# Patient Record
Sex: Female | Born: 1965
Health system: Southern US, Community
[De-identification: ages and names within clinical notes are randomized; demographics above are authoritative.]

## PROBLEM LIST (undated history)

## (undated) DIAGNOSIS — M722 Plantar fascial fibromatosis: Secondary | ICD-10-CM

## (undated) DIAGNOSIS — T7840XA Allergy, unspecified, initial encounter: Secondary | ICD-10-CM

## (undated) DIAGNOSIS — L309 Dermatitis, unspecified: Secondary | ICD-10-CM

## (undated) DIAGNOSIS — L709 Acne, unspecified: Secondary | ICD-10-CM

## (undated) DIAGNOSIS — D649 Anemia, unspecified: Secondary | ICD-10-CM

## (undated) HISTORY — DX: Dermatitis, unspecified: L30.9

## (undated) HISTORY — DX: Allergy, unspecified, initial encounter: T78.40XA

## (undated) HISTORY — DX: Plantar fascial fibromatosis: M72.2

## (undated) HISTORY — DX: Acne, unspecified: L70.9

## (undated) HISTORY — DX: Anemia, unspecified: D64.9

---

## 1993-10-30 HISTORY — PX: PELVIC LAPAROSCOPY: SHX162

## 1999-06-16 ENCOUNTER — Other Ambulatory Visit: Admission: RE | Admit: 1999-06-16 | Discharge: 1999-06-16 | Payer: Self-pay | Admitting: Gynecology

## 1999-09-09 ENCOUNTER — Other Ambulatory Visit: Admission: RE | Admit: 1999-09-09 | Discharge: 1999-09-09 | Payer: Self-pay | Admitting: Gynecology

## 2000-07-20 ENCOUNTER — Other Ambulatory Visit: Admission: RE | Admit: 2000-07-20 | Discharge: 2000-07-20 | Payer: Self-pay | Admitting: Gynecology

## 2001-08-01 ENCOUNTER — Other Ambulatory Visit: Admission: RE | Admit: 2001-08-01 | Discharge: 2001-08-01 | Payer: Self-pay | Admitting: Gynecology

## 2002-06-20 ENCOUNTER — Other Ambulatory Visit: Admission: RE | Admit: 2002-06-20 | Discharge: 2002-06-20 | Payer: Self-pay | Admitting: Gynecology

## 2003-06-27 ENCOUNTER — Encounter: Admission: RE | Admit: 2003-06-27 | Discharge: 2003-06-27 | Payer: Self-pay | Admitting: Gynecology

## 2003-06-28 ENCOUNTER — Other Ambulatory Visit: Admission: RE | Admit: 2003-06-28 | Discharge: 2003-06-28 | Payer: Self-pay | Admitting: Gynecology

## 2003-07-03 ENCOUNTER — Ambulatory Visit (HOSPITAL_COMMUNITY): Admission: RE | Admit: 2003-07-03 | Discharge: 2003-07-03 | Payer: Self-pay | Admitting: Family Medicine

## 2003-07-03 ENCOUNTER — Encounter (INDEPENDENT_AMBULATORY_CARE_PROVIDER_SITE_OTHER): Payer: Self-pay | Admitting: Cardiology

## 2004-07-25 ENCOUNTER — Other Ambulatory Visit: Admission: RE | Admit: 2004-07-25 | Discharge: 2004-07-25 | Payer: Self-pay | Admitting: Gynecology

## 2005-07-30 ENCOUNTER — Other Ambulatory Visit: Admission: RE | Admit: 2005-07-30 | Discharge: 2005-07-30 | Payer: Self-pay | Admitting: Gynecology

## 2005-08-06 ENCOUNTER — Emergency Department (HOSPITAL_COMMUNITY): Admission: EM | Admit: 2005-08-06 | Discharge: 2005-08-06 | Payer: Self-pay | Admitting: Emergency Medicine

## 2006-08-02 ENCOUNTER — Other Ambulatory Visit: Admission: RE | Admit: 2006-08-02 | Discharge: 2006-08-02 | Payer: Self-pay | Admitting: Gynecology

## 2007-08-03 ENCOUNTER — Other Ambulatory Visit: Admission: RE | Admit: 2007-08-03 | Discharge: 2007-08-03 | Payer: Self-pay | Admitting: Gynecology

## 2007-10-07 ENCOUNTER — Encounter: Admission: RE | Admit: 2007-10-07 | Discharge: 2007-10-07 | Payer: Self-pay | Admitting: Gynecology

## 2008-08-06 ENCOUNTER — Ambulatory Visit: Payer: Self-pay | Admitting: Gynecology

## 2008-08-06 ENCOUNTER — Other Ambulatory Visit: Admission: RE | Admit: 2008-08-06 | Discharge: 2008-08-06 | Payer: Self-pay | Admitting: Gynecology

## 2008-08-06 ENCOUNTER — Encounter: Payer: Self-pay | Admitting: Gynecology

## 2008-10-19 ENCOUNTER — Encounter: Admission: RE | Admit: 2008-10-19 | Discharge: 2008-10-19 | Payer: Self-pay | Admitting: Gynecology

## 2009-08-08 ENCOUNTER — Ambulatory Visit: Payer: Self-pay | Admitting: Gynecology

## 2009-08-08 ENCOUNTER — Other Ambulatory Visit: Admission: RE | Admit: 2009-08-08 | Discharge: 2009-08-08 | Payer: Self-pay | Admitting: Gynecology

## 2009-08-09 ENCOUNTER — Ambulatory Visit: Payer: Self-pay | Admitting: Gynecology

## 2009-10-21 ENCOUNTER — Encounter: Admission: RE | Admit: 2009-10-21 | Discharge: 2009-10-21 | Payer: Self-pay | Admitting: Gynecology

## 2010-01-03 HISTORY — PX: TRANSTHORACIC ECHOCARDIOGRAM: SHX275

## 2010-06-01 ENCOUNTER — Encounter: Payer: Self-pay | Admitting: Gynecology

## 2010-06-02 ENCOUNTER — Encounter: Payer: Self-pay | Admitting: Gynecology

## 2010-08-11 ENCOUNTER — Other Ambulatory Visit: Payer: Self-pay | Admitting: Gynecology

## 2010-08-11 ENCOUNTER — Other Ambulatory Visit (HOSPITAL_COMMUNITY)
Admission: RE | Admit: 2010-08-11 | Discharge: 2010-08-11 | Disposition: A | Discharge status: Home / Self Care | Payer: BC Managed Care – PPO | Source: Ambulatory Visit | Attending: Gynecology | Admitting: Gynecology

## 2010-08-11 ENCOUNTER — Encounter (INDEPENDENT_AMBULATORY_CARE_PROVIDER_SITE_OTHER): Payer: BC Managed Care – PPO | Admitting: Gynecology

## 2010-08-11 DIAGNOSIS — B373 Candidiasis of vulva and vagina: Secondary | ICD-10-CM

## 2010-08-11 DIAGNOSIS — Z124 Encounter for screening for malignant neoplasm of cervix: Secondary | ICD-10-CM | POA: Insufficient documentation

## 2010-08-11 DIAGNOSIS — Z833 Family history of diabetes mellitus: Secondary | ICD-10-CM

## 2010-08-11 DIAGNOSIS — N912 Amenorrhea, unspecified: Secondary | ICD-10-CM

## 2010-08-11 DIAGNOSIS — Z01419 Encounter for gynecological examination (general) (routine) without abnormal findings: Secondary | ICD-10-CM

## 2010-08-11 DIAGNOSIS — Z13220 Encounter for screening for lipoid disorders: Secondary | ICD-10-CM

## 2010-09-26 ENCOUNTER — Other Ambulatory Visit: Payer: Self-pay | Admitting: Gynecology

## 2010-09-26 DIAGNOSIS — Z1231 Encounter for screening mammogram for malignant neoplasm of breast: Secondary | ICD-10-CM

## 2010-10-23 ENCOUNTER — Ambulatory Visit: Payer: BC Managed Care – PPO

## 2010-10-24 ENCOUNTER — Ambulatory Visit
Admission: RE | Admit: 2010-10-24 | Discharge: 2010-10-24 | Disposition: A | Discharge status: Home / Self Care | Payer: BC Managed Care – PPO | Source: Ambulatory Visit | Attending: Gynecology | Admitting: Gynecology

## 2010-10-24 DIAGNOSIS — Z1231 Encounter for screening mammogram for malignant neoplasm of breast: Secondary | ICD-10-CM

## 2011-06-09 ENCOUNTER — Encounter: Payer: Self-pay | Admitting: Gynecology

## 2011-06-09 ENCOUNTER — Ambulatory Visit (INDEPENDENT_AMBULATORY_CARE_PROVIDER_SITE_OTHER): Payer: Self-pay | Admitting: Gynecology

## 2011-06-09 VITALS — BP 130/90

## 2011-06-09 DIAGNOSIS — N912 Amenorrhea, unspecified: Secondary | ICD-10-CM

## 2011-06-09 DIAGNOSIS — N949 Unspecified condition associated with female genital organs and menstrual cycle: Secondary | ICD-10-CM

## 2011-06-09 DIAGNOSIS — D219 Benign neoplasm of connective and other soft tissue, unspecified: Secondary | ICD-10-CM

## 2011-06-09 DIAGNOSIS — R102 Pelvic and perineal pain: Secondary | ICD-10-CM

## 2011-06-09 DIAGNOSIS — D259 Leiomyoma of uterus, unspecified: Secondary | ICD-10-CM

## 2011-06-09 LAB — PROLACTIN: Prolactin: 9.4 ng/mL

## 2011-06-09 NOTE — Progress Notes (Signed)
Patient is a 46 year old gravida 0 with complaint today of not having a menstrual period for the past 3 months. She has not been sexually active in 7 months. She is on Junel 120 oral contraceptive pill and has been compliant. Patient has gained some weight but denies any visual disturbances, headaches or nipple discharge. Patient with prior history several years ago of laparoscopic myomectomy. Patient complaining of vague pelvic pains from midline to left lower quadrant.  Exam: Abdomen: Soft nontender no rebound or guarding Pelvic: Bartholin urethra Skene glands within normal limits Vagina: No gross lesions on inspection Cervix no lesions or discharge  Uterus anteverted irregular shaped fundal region at the patient's left side no tenderness elicited. Adnexa: No palpable masses or tenderness Rectal: Not examined  UPT: Negative  Assessment: Amenorrhea while on oral contraceptive pill. Patient reassured that she maintains good compliance there is no concern or worry of not having a menstrual cycle. We'll check her TSH and prolactin. As a result of her pelvic pain and irregularity on the uterus and prior history of myomectomy for fibroids will schedule an ultrasound within the next week.

## 2011-06-09 NOTE — Patient Instructions (Signed)
Its ok not to have a menstrual period while on the birth control pill as long as you don't miss any. Will take a good look with ultrasound of your uterus because I believe the fibroids are back and may be the source of your recent discomfort. Will check your TSH and Prolactin to make sure all are ok.

## 2011-06-10 ENCOUNTER — Encounter: Payer: Self-pay | Admitting: Gynecology

## 2011-06-10 ENCOUNTER — Ambulatory Visit (INDEPENDENT_AMBULATORY_CARE_PROVIDER_SITE_OTHER): Payer: Managed Care, Other (non HMO)

## 2011-06-10 ENCOUNTER — Ambulatory Visit (INDEPENDENT_AMBULATORY_CARE_PROVIDER_SITE_OTHER): Payer: Managed Care, Other (non HMO) | Admitting: Gynecology

## 2011-06-10 VITALS — BP 132/88

## 2011-06-10 DIAGNOSIS — D219 Benign neoplasm of connective and other soft tissue, unspecified: Secondary | ICD-10-CM

## 2011-06-10 DIAGNOSIS — N912 Amenorrhea, unspecified: Secondary | ICD-10-CM

## 2011-06-10 DIAGNOSIS — R102 Pelvic and perineal pain: Secondary | ICD-10-CM

## 2011-06-10 DIAGNOSIS — D259 Leiomyoma of uterus, unspecified: Secondary | ICD-10-CM

## 2011-06-10 DIAGNOSIS — N949 Unspecified condition associated with female genital organs and menstrual cycle: Secondary | ICD-10-CM

## 2011-06-10 DIAGNOSIS — D251 Intramural leiomyoma of uterus: Secondary | ICD-10-CM

## 2011-06-10 NOTE — Patient Instructions (Signed)
Your ultrasound today was normal with exception of 2 very small insignificant fibroids. Remember to continue oral contraceptive pills as previously prescribed. Your recent labs were normal also. We'll see you next year her to sleep.

## 2011-06-10 NOTE — Progress Notes (Addendum)
Patient is a 46 year old gravida 0 concern that she had not had a menstrual period in 3 months although she is on the oral contraceptive pill Junel 1/20. She was seen the office in January 29 for annual exam.  She has been compliant on her oral contraceptive pill. She also has history of fibroids in the past and has pelvic pains midline radiating to left lower quadrant. Her TSH and FSH recently drawn were normal.  Results of ultrasound as follows: (October 2012) Uterus 7.1 x 3.7 x 2.4 cm with an endometrial stripe of 1.5 mm. She had 2 small fibroids one measured 11 x 9 mm and another 1 6 x 6 mm. Both ovaries are otherwise normal.  Patient was reassured she's otherwise scheduled to return back to the office next year or when necessary.

## 2011-07-01 ENCOUNTER — Ambulatory Visit (INDEPENDENT_AMBULATORY_CARE_PROVIDER_SITE_OTHER): Payer: Managed Care, Other (non HMO) | Admitting: Gynecology

## 2011-07-01 ENCOUNTER — Encounter: Payer: Self-pay | Admitting: Gynecology

## 2011-07-01 VITALS — BP 132/86

## 2011-07-01 DIAGNOSIS — N6459 Other signs and symptoms in breast: Secondary | ICD-10-CM

## 2011-07-01 DIAGNOSIS — N61 Mastitis without abscess: Secondary | ICD-10-CM

## 2011-07-01 DIAGNOSIS — L539 Erythematous condition, unspecified: Secondary | ICD-10-CM

## 2011-07-01 DIAGNOSIS — N649 Disorder of breast, unspecified: Secondary | ICD-10-CM

## 2011-07-01 DIAGNOSIS — L538 Other specified erythematous conditions: Secondary | ICD-10-CM

## 2011-07-01 MED ORDER — NYSTATIN-TRIAMCINOLONE 100000-0.1 UNIT/GM-% EX CREA: TOPICAL_CREAM | Freq: Three times a day (TID) | CUTANEOUS | Status: AC

## 2011-07-01 NOTE — Patient Instructions (Signed)
Will call you with follow up appt. With General Surgeon

## 2011-07-01 NOTE — Progress Notes (Signed)
Patient presented to the office today complaining that over the past week she has had left nipple pruritus and burning sensation and has noted a raised area. Patient states that she has not palpated any masses and denies any nipple discharge. Patient had a normal mammogram May 2012. Patient with no first-line relative with breast cancer only a distant aunt. Patient's currently on low dose oral contraceptive pill (Junel 1/20). Patient denies any recent trauma or injury to the area.  Breast exam: Right breast: No palpable masses or tenderness no skin discoloration no nipple inversion no supraclavicular axillary lymphadenopathy  Left breast: From the 10 to 12:00 position on the areolar region is a 1-2 cm raised, scaly hyperpigmented lesion. No other abnormality on the left breast noted no masses, no supraclavicular axillary lymphadenopathy.   Physical Exam  Pulmonary/Chest:     Assessment/plan: Patient will be started on a combination of steroid and antifungal cream (mytrex) to apply 2-3 times a day for approximately 10-14 days in the event that this is a fundal lesion. Nevertheless, I would like to refer to the general surgeon in the event this is not clear and she will need a biopsy to rule out cancer. Other possibility may be psoriasis. Information was shared with the patient and she will follow accordingly and we'll wait for followup with a general surgeon after completion of the above-mentioned treatment.

## 2011-07-02 ENCOUNTER — Telehealth: Payer: Self-pay | Admitting: *Deleted

## 2011-07-02 NOTE — Telephone Encounter (Signed)
Message copied by Libby Maw on Thu Jul 02, 2011  4:10 PM ------      Message from: Ok Edwards      Created: Wed Jul 01, 2011  2:08 PM       Florence Antonelli, please schedule an appointment with one of the general surgeons in 10-14 days as a result of a left breast lesion. Please forward my encounter note from today. Patient waiting your call. Thank you

## 2011-07-02 NOTE — Telephone Encounter (Signed)
Lm for patient to call. appt set up with Dr. Dwain Sarna on 07/21/11 @9 :10am with 8:45am arrival

## 2011-07-06 NOTE — Telephone Encounter (Signed)
Patient returned call, told patient we had made appt for Dr. Dwain Sarna to see her and she said she didn't need appointment.  The area in question has cleared up.  Wanted to let you know.

## 2011-07-06 NOTE — Telephone Encounter (Signed)
Called [facility] and cancelled appt.

## 2011-07-06 NOTE — Telephone Encounter (Signed)
Lm for patient to call

## 2011-07-06 NOTE — Telephone Encounter (Signed)
I tried to call patient and left a message. And antifungal cream to clear the area completely and she feels no masses and she can cancel the appointment with a general surgeon. She has any questions she can call me or come by the office for me to reinspect again.

## 2011-07-21 ENCOUNTER — Ambulatory Visit (INDEPENDENT_AMBULATORY_CARE_PROVIDER_SITE_OTHER): Payer: Self-pay | Admitting: General Surgery

## 2011-08-14 ENCOUNTER — Ambulatory Visit (INDEPENDENT_AMBULATORY_CARE_PROVIDER_SITE_OTHER): Payer: Managed Care, Other (non HMO) | Admitting: Gynecology

## 2011-08-14 ENCOUNTER — Encounter: Payer: Self-pay | Admitting: Gynecology

## 2011-08-14 ENCOUNTER — Encounter: Payer: Managed Care, Other (non HMO) | Admitting: Gynecology

## 2011-08-14 VITALS — BP 120/78 | Ht 63.0 in | Wt 185.0 lb

## 2011-08-14 DIAGNOSIS — Z833 Family history of diabetes mellitus: Secondary | ICD-10-CM

## 2011-08-14 DIAGNOSIS — R635 Abnormal weight gain: Secondary | ICD-10-CM

## 2011-08-14 DIAGNOSIS — N949 Unspecified condition associated with female genital organs and menstrual cycle: Secondary | ICD-10-CM

## 2011-08-14 DIAGNOSIS — Z01419 Encounter for gynecological examination (general) (routine) without abnormal findings: Secondary | ICD-10-CM

## 2011-08-14 DIAGNOSIS — R102 Pelvic and perineal pain: Secondary | ICD-10-CM

## 2011-08-14 MED ORDER — NORETHIN ACE-ETH ESTRAD-FE 1-20 MG-MCG PO TABS: 1.0000 | ORAL_TABLET | Freq: Every day | ORAL | Status: DC

## 2011-08-14 NOTE — Patient Instructions (Signed)
Remember to follow up with your mammogram in May. Will see you in 1-2 weeks for ultrasound. Continue to exercise and eat healthy.

## 2011-08-14 NOTE — Progress Notes (Signed)
Colleen Gonzalez June 24, 1965 [id number]   History:    46 y.o.  for annual exam with complaint of weight gain and left lower quadrant discomfort. 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 Normal ovaries. Patient has had history in the past of a laparoscopic myomectomy. She's currently on Janel 120 oral contraceptive pill. Her last mammogram was in 2012. Patient does her monthly self breast examination. Patient with strong family history of diabetes. Her other concern is her weight gain as well.  Past medical history,surgical history, family history and social history were all reviewed and documented in the EPIC chart.  Gynecologic History No LMP recorded. Patient is not currently having periods (Reason: Oral contraceptives). Contraception: OCP (estrogen/progesterone) Last Pap: 2012. Results were: normal Last mammogram: 2012. Results were: normal  Obstetric History OB History    Grav Para Term Preterm Abortions TAB SAB Ect Mult Living                   ROS:  Was performed and pertinent positives and negatives are included in the history.  Exam: chaperone present  BP 120/78  Ht 5\' 3"  (1.6 m)  Wt 185 lb (83.915 kg)  BMI 32.77 kg/m2  Body mass index is 32.77 kg/(m^2).  General appearance : Well developed well nourished female. No acute distress HEENT: Neck supple, trachea midline, no carotid bruits, no thyroidmegaly Lungs: Clear to auscultation, no rhonchi or wheezes, or rib retractions  Heart: Regular rate and rhythm, no murmurs or gallops Breast:Examined in sitting and supine position were symmetrical in appearance, no palpable masses or tenderness,  no skin retraction, no nipple inversion, no nipple discharge, no skin discoloration, no axillary or supraclavicular lymphadenopathy Abdomen: no palpable masses or tenderness, no rebound or guarding Extremities: no edema or skin discoloration or  tenderness  Pelvic:  Bartholin, Urethra, Skene Glands: Within normal limits             Vagina: No gross lesions or discharge  Cervix: No gross lesions or discharge  Uterus  anteverted slightly irregular, normal size, shape and consistency, non-tender and mobile  Adnexa  tender on the left adnexa difficult to evaluate  Anus and perineum  normal   Rectovaginal  normal sphincter tone without palpated masses or tenderness             Hemoccult not done, normal rectal exam     Assessment/Plan:  45 y.o. female for annual exam who has been complaining on and off at times of left lower quadrant discomfort. Patient had ultrasound done in October 2012. She has 2 small fibroids measuring 11 x 9 mm and 6 x 6 mm respectively and normal-appearing ovaries during that scan. She has no GI or GU complaints. We'll schedule an ultrasound for next week to see if there is any change in the past 6 months because of her left lower quadrant discomfort and incomplete evaluation today during pelvic exam. Because her family history of diabetes we'll do a fasting blood sugar today and because of her weight gain we'll check her TSH today. Additional labs will include a CBC and urinalysis as well as a fasting lipid profile. New Pap smear screening guidelines discussed. Patient would no prior history of abnormal Pap smears. Patient with normal Pap smear in 2012 will not need one for 2 more years. She was encouraged to schedule her mammogram and to continue to do her  monthly self breast examination. We discussed importance of regular exercise and diet.    Ok Edwards MD, 4:44 PM 08/14/2011

## 2011-08-15 LAB — URINALYSIS W MICROSCOPIC + REFLEX CULTURE
Hgb urine dipstick: NEGATIVE
Leukocytes, UA: NEGATIVE
Nitrite: NEGATIVE
Protein, ur: NEGATIVE mg/dL

## 2011-08-15 LAB — CBC WITH DIFFERENTIAL/PLATELET
Basophils Relative: 1 % (ref 0–1)
Eosinophils Absolute: 0.3 10*3/uL (ref 0.0–0.7)
MCH: 27.6 pg (ref 26.0–34.0)
MCHC: 31.2 g/dL (ref 30.0–36.0)
Neutrophils Relative %: 39 % — ABNORMAL LOW (ref 43–77)
Platelets: 370 10*3/uL (ref 150–400)
RBC: 4.17 MIL/uL (ref 3.87–5.11)

## 2011-08-15 LAB — LIPID PANEL
Cholesterol: 163 mg/dL (ref 0–200)
Total CHOL/HDL Ratio: 2.9 Ratio
Triglycerides: 71 mg/dL (ref <150)
VLDL: 14 mg/dL (ref 0–40)

## 2011-08-16 LAB — URINE CULTURE
Colony Count: NO GROWTH
Organism ID, Bacteria: NO GROWTH

## 2011-08-19 ENCOUNTER — Other Ambulatory Visit: Payer: Self-pay

## 2011-08-19 DIAGNOSIS — R3129 Other microscopic hematuria: Secondary | ICD-10-CM

## 2011-08-28 ENCOUNTER — Ambulatory Visit (INDEPENDENT_AMBULATORY_CARE_PROVIDER_SITE_OTHER): Payer: Managed Care, Other (non HMO)

## 2011-08-28 ENCOUNTER — Encounter: Payer: Self-pay | Admitting: Gynecology

## 2011-08-28 ENCOUNTER — Ambulatory Visit (INDEPENDENT_AMBULATORY_CARE_PROVIDER_SITE_OTHER): Payer: Managed Care, Other (non HMO) | Admitting: Gynecology

## 2011-08-28 DIAGNOSIS — R102 Pelvic and perineal pain: Secondary | ICD-10-CM

## 2011-08-28 DIAGNOSIS — D259 Leiomyoma of uterus, unspecified: Secondary | ICD-10-CM

## 2011-08-28 DIAGNOSIS — R3129 Other microscopic hematuria: Secondary | ICD-10-CM

## 2011-08-28 DIAGNOSIS — D649 Anemia, unspecified: Secondary | ICD-10-CM

## 2011-08-28 DIAGNOSIS — R109 Unspecified abdominal pain: Secondary | ICD-10-CM

## 2011-08-28 NOTE — Progress Notes (Signed)
46 y.o. for annual exam with complaint of weight gain and left lower quadrant discomfort. 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 Normal ovaries. Patient has had history in the past of a laparoscopic myomectomy. She's currently on Janel 120 oral contraceptive pill. Patient presented to the office today for followup ultrasound. The ultrasound with the following findings:  Uterus measures 7.1 x 3.7 x 3.0 cm with an endometrial stripe of 2.9 mm 2 small fibroids one measuring 12 x 8 mm a second one measuring 8 x 6 mm essentially no change from last year. Both ovaries appeared to be normal otherwise.  Review of her record indicated the patient continues to suffer from chronic anemia for which she is on iron supplementation daily and her last CBC demonstrated a hemoglobin 11.5 with hematocrit 36.9 with normal indices. She's is hardly having any menstrual cycle on the oral contraceptive pill. She states her bowel movements are normal. She denies any family history of any GI malignancies. She denies any blood in her stools or feeling bloated.  Urinalysis be repeated today due to the fact that time of her annual exam there was evidence of microscopic hematuria patient states she was here that time of her menses.  Patient will be referred to the gastroenterologist for further evaluation of her left lower abdominal pains and chronic anemia. Her recent lipid profile was normal as was her blood sugar.

## 2011-08-28 NOTE — Patient Instructions (Signed)
Will call you with appointment to [facility] for follow up.

## 2011-08-29 LAB — URINALYSIS W MICROSCOPIC + REFLEX CULTURE
Bacteria, UA: NONE SEEN
Crystals: NONE SEEN
Ketones, ur: NEGATIVE mg/dL
Nitrite: NEGATIVE
Protein, ur: NEGATIVE mg/dL
Specific Gravity, Urine: 1.02 (ref 1.005–1.030)
Urobilinogen, UA: 0.2 mg/dL (ref 0.0–1.0)

## 2011-08-31 ENCOUNTER — Other Ambulatory Visit: Payer: Self-pay | Admitting: *Deleted

## 2011-08-31 ENCOUNTER — Encounter: Payer: Self-pay | Admitting: Internal Medicine

## 2011-08-31 ENCOUNTER — Telehealth: Payer: Self-pay | Admitting: *Deleted

## 2011-08-31 DIAGNOSIS — R1030 Lower abdominal pain, unspecified: Secondary | ICD-10-CM

## 2011-08-31 NOTE — Telephone Encounter (Signed)
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 Thank you! ----- Message ----- From: Otto Herb, CNA Sent: 08/28/2011 12:06 PM To: Ok Edwards, MD  You didn't attach a patient name. Let me know who it is thanks. ----- Message ----- From: Ok Edwards, MD Sent: 08/28/2011 12:02 PM To: Leyland Kenna Duwaine Maxin, CNA  Amore Grater, please schedule an appointment for this patient at [facility] for chronic left lower abdominal pain and chronic anemia. See last encounter note. We make appointment for her she requested to be done anytime before 4 PM preferably in the morning. Thank you               Patient informed appt set up with Dr. Rhea Belton on 09/09/11 @ 8:45am.

## 2011-09-09 ENCOUNTER — Ambulatory Visit: Payer: Managed Care, Other (non HMO) | Admitting: Internal Medicine

## 2011-09-15 ENCOUNTER — Other Ambulatory Visit: Payer: Self-pay | Admitting: Gynecology

## 2011-09-15 DIAGNOSIS — Z1231 Encounter for screening mammogram for malignant neoplasm of breast: Secondary | ICD-10-CM

## 2011-09-21 ENCOUNTER — Other Ambulatory Visit: Payer: Self-pay | Admitting: *Deleted

## 2011-09-21 MED ORDER — NORETHIN ACE-ETH ESTRAD-FE 1-20 MG-MCG PO TABS: ORAL_TABLET | ORAL | Status: DC

## 2011-10-30 ENCOUNTER — Ambulatory Visit
Admission: RE | Admit: 2011-10-30 | Discharge: 2011-10-30 | Disposition: A | Discharge status: Home / Self Care | Payer: Managed Care, Other (non HMO) | Source: Ambulatory Visit | Attending: Gynecology | Admitting: Gynecology

## 2011-10-30 DIAGNOSIS — Z1231 Encounter for screening mammogram for malignant neoplasm of breast: Secondary | ICD-10-CM

## 2012-05-06 ENCOUNTER — Ambulatory Visit (HOSPITAL_COMMUNITY)
Admission: RE | Admit: 2012-05-06 | Discharge: 2012-05-06 | Disposition: A | Discharge status: Home / Self Care | Payer: Managed Care, Other (non HMO) | Source: Ambulatory Visit | Attending: Orthopedic Surgery | Admitting: Orthopedic Surgery

## 2012-05-06 ENCOUNTER — Other Ambulatory Visit (HOSPITAL_COMMUNITY): Payer: Self-pay | Admitting: Orthopedic Surgery

## 2012-05-06 DIAGNOSIS — R609 Edema, unspecified: Secondary | ICD-10-CM

## 2012-05-06 DIAGNOSIS — R0989 Other specified symptoms and signs involving the circulatory and respiratory systems: Secondary | ICD-10-CM

## 2012-05-06 NOTE — Progress Notes (Signed)
Bilateral:  No evidence of DVT, superficial thrombosis, or Baker's Cyst.   

## 2012-05-09 ENCOUNTER — Ambulatory Visit (HOSPITAL_COMMUNITY)
Admission: RE | Admit: 2012-05-09 | Discharge: 2012-05-09 | Disposition: A | Discharge status: Home / Self Care | Payer: Managed Care, Other (non HMO) | Source: Ambulatory Visit | Attending: Orthopedic Surgery | Admitting: Orthopedic Surgery

## 2012-05-09 DIAGNOSIS — R0989 Other specified symptoms and signs involving the circulatory and respiratory systems: Secondary | ICD-10-CM

## 2012-05-09 DIAGNOSIS — R609 Edema, unspecified: Secondary | ICD-10-CM

## 2012-05-09 NOTE — Progress Notes (Signed)
VASCULAR LAB PRELIMINARY  ARTERIAL  ABI completed:    RIGHT    LEFT    PRESSURE WAVEFORM  PRESSURE WAVEFORM  BRACHIAL 149 Triphasic BRACHIAL 133 Triphasic  DP 145 Monophasic DP 125 Biphasic  AT   AT    PT 126 Biphasic PT 151 Biphasic  PER   PER    GREAT TOE  NA GREAT TOE  NA    RIGHT LEFT  ABI 0.97 1.01    The right ABI=0.97,  left ABI=1.01, which is within normal limits bilaterally. The vertebral and subclavian arteries were imaged and are patent with antegrade flow bilaterally.  05/09/2012 2:14 PM Gertie Fey, RDMS, RDCS

## 2012-05-10 ENCOUNTER — Encounter (HOSPITAL_COMMUNITY): Payer: Managed Care, Other (non HMO)

## 2012-08-19 ENCOUNTER — Encounter: Payer: Self-pay | Admitting: Gynecology

## 2012-08-19 ENCOUNTER — Ambulatory Visit (INDEPENDENT_AMBULATORY_CARE_PROVIDER_SITE_OTHER): Payer: Managed Care, Other (non HMO) | Admitting: Gynecology

## 2012-08-19 VITALS — BP 154/84 | Ht 63.25 in | Wt 188.0 lb

## 2012-08-19 DIAGNOSIS — R635 Abnormal weight gain: Secondary | ICD-10-CM

## 2012-08-19 DIAGNOSIS — Z833 Family history of diabetes mellitus: Secondary | ICD-10-CM

## 2012-08-19 DIAGNOSIS — Z01419 Encounter for gynecological examination (general) (routine) without abnormal findings: Secondary | ICD-10-CM

## 2012-08-19 DIAGNOSIS — Z23 Encounter for immunization: Secondary | ICD-10-CM

## 2012-08-19 LAB — COMPREHENSIVE METABOLIC PANEL
CO2: 26 mEq/L (ref 19–32)
Creat: 0.87 mg/dL (ref 0.50–1.10)
Glucose, Bld: 76 mg/dL (ref 70–99)
Sodium: 140 mEq/L (ref 135–145)
Total Bilirubin: 0.4 mg/dL (ref 0.3–1.2)
Total Protein: 6.9 g/dL (ref 6.0–8.3)

## 2012-08-19 LAB — CBC WITH DIFFERENTIAL/PLATELET
Eosinophils Absolute: 0.6 10*3/uL (ref 0.0–0.7)
Eosinophils Relative: 14 % — ABNORMAL HIGH (ref 0–5)
Hemoglobin: 12.1 g/dL (ref 12.0–15.0)
Lymphs Abs: 1.7 10*3/uL (ref 0.7–4.0)
MCH: 28.1 pg (ref 26.0–34.0)
MCV: 86.1 fL (ref 78.0–100.0)
Monocytes Relative: 7 % (ref 3–12)
RBC: 4.31 MIL/uL (ref 3.87–5.11)

## 2012-08-19 LAB — TSH: TSH: 1.659 u[IU]/mL (ref 0.350–4.500)

## 2012-08-19 MED ORDER — NORETHIN ACE-ETH ESTRAD-FE 1-20 MG-MCG PO TABS: ORAL_TABLET | ORAL | Status: DC

## 2012-08-19 NOTE — Progress Notes (Signed)
Colleen Gonzalez 12/12/1965 [id number]   History:    47 y.o.  for annual gyn exam will with no complaints today. Patient is doing well on Junel 1/20 28 day oral contraceptive pill is having normal menstrual cycles. Patient was seen in April 2013 had an ultrasound 2 small fibroids were noted the ovaries are otherwise normal. Patient with past history of laparoscopic myomectomy in 1995. Patient with no prior history of abnormal Pap smears. Patient's father with history of non-insulin-dependent diabetes.  Past medical history,surgical history, family history and social history were all reviewed and documented in the EPIC chart.  Gynecologic History No LMP recorded. Patient is not currently having periods (Reason: Oral contraceptives). Contraception: OCP (estrogen/progesterone) Last Pap: 2012. Results were: normal Last mammogram: 2013. Results were: normal  Obstetric History OB History   Grav Para Term Preterm Abortions TAB SAB Ect Mult Living   0                ROS: A ROS was performed and pertinent positives and negatives are included in the history.  GENERAL: No fevers or chills. HEENT: No change in vision, no earache, sore throat or sinus congestion. NECK: No pain or stiffness. CARDIOVASCULAR: No chest pain or pressure. No palpitations. PULMONARY: No shortness of breath, cough or wheeze. GASTROINTESTINAL: No abdominal pain, nausea, vomiting or diarrhea, melena or bright red blood per rectum. GENITOURINARY: No urinary frequency, urgency, hesitancy or dysuria. MUSCULOSKELETAL: No joint or muscle pain, no back pain, no recent trauma. DERMATOLOGIC: No rash, no itching, no lesions. ENDOCRINE: No polyuria, polydipsia, no heat or cold intolerance. No recent change in weight. HEMATOLOGICAL: No anemia or easy bruising or bleeding. NEUROLOGIC: No headache, seizures, numbness, tingling or weakness. PSYCHIATRIC: No depression, no loss of interest in normal activity or change in sleep pattern.      Exam: chaperone present  BP 154/84  Ht 5' 3.25" (1.607 m)  Wt 188 lb (85.276 kg)  BMI 33.02 kg/m2  Body mass index is 33.02 kg/(m^2).  General appearance : Well developed well nourished female. No acute distress HEENT: Neck supple, trachea midline, no carotid bruits, no thyroidmegaly Lungs: Clear to auscultation, no rhonchi or wheezes, or rib retractions  Heart: Regular rate and rhythm, no murmurs or gallops Breast:Examined in sitting and supine position were symmetrical in appearance, no palpable masses or tenderness,  no skin retraction, no nipple inversion, no nipple discharge, no skin discoloration, no axillary or supraclavicular lymphadenopathy Abdomen: no palpable masses or tenderness, no rebound or guarding Extremities: no edema or skin discoloration or tenderness  Pelvic:  Bartholin, Urethra, Skene Glands: Within normal limits             Vagina: No gross lesions or discharge  Cervix: No gross lesions or discharge  Uterus  anteverted, normal size, shape and consistency, non-tender and mobile  Adnexa  Without masses or tenderness  Anus and perineum  normal   Rectovaginal  normal sphincter tone without palpated masses or tenderness             Hemoccult not indicated     Assessment/Plan:  47 y.o. female for annual exam cool will not need a Pap smear this year but next year according to the guidelines. She was reminded to do her monthly self breast examination and to schedule her mammogram in June. The following labs were ordered: CBC, fasting lipid profile, comprehensive metabolic panel, and urinalysis. Tdap vaccine was administered today and literature formation was provided. We discussed about the importance of calcium  and vitamin D and regular exercise for osteoporosis prevention.    Ok Edwards MD, 6:01 PM 08/19/2012

## 2012-08-19 NOTE — Patient Instructions (Addendum)
Tetanus, Diphtheria, Pertussis (Tdap) Vaccine What You Need to Know WHY GET VACCINATED? Tetanus, diphtheria and pertussis can be very serious diseases, even for adolescents and adults. Tdap vaccine can protect us from these diseases. TETANUS (Lockjaw) causes painful muscle tightening and stiffness, usually all over the body.  It can lead to tightening of muscles in the head and neck so you can't open your mouth, swallow, or sometimes even breathe. Tetanus kills about 1 out of 5 people who are infected. DIPHTHERIA can cause a thick coating to form in the back of the throat.  It can lead to breathing problems, paralysis, heart failure, and death. PERTUSSIS (Whooping Cough) causes severe coughing spells, which can cause difficulty breathing, vomiting and disturbed sleep.  It can also lead to weight loss, incontinence, and rib fractures. Up to 2 in 100 adolescents and 5 in 100 adults with pertussis are hospitalized or have complications, which could include pneumonia and death. These diseases are caused by bacteria. Diphtheria and pertussis are spread from person to person through coughing or sneezing. Tetanus enters the body through cuts, scratches, or wounds. Before vaccines, the [place] saw as many as 200,000 cases a year of diphtheria and pertussis, and hundreds of cases of tetanus. Since vaccination began, tetanus and diphtheria have dropped by about 99% and pertussis by about 80%. TDAP VACCINE Tdap vaccine can protect adolescents and adults from tetanus, diphtheria, and pertussis. One dose of Tdap is routinely given at age 11 or 12. People who did not get Tdap at that age should get it as soon as possible. Tdap is especially important for health care professionals and anyone having close contact with a baby younger than 12 months. Pregnant women should get a dose of Tdap during every pregnancy, to protect the newborn from pertussis. Infants are most at risk for severe, life-threatening  complications from pertussis. A similar vaccine, called Td, protects from tetanus and diphtheria, but not pertussis. A Td booster should be given every 10 years. Tdap may be given as one of these boosters if you have not already gotten a dose. Tdap may also be given after a severe cut or burn to prevent tetanus infection. Your doctor can give you more information. Tdap may safely be given at the same time as other vaccines. SOME PEOPLE SHOULD NOT GET THIS VACCINE  If you ever had a life-threatening allergic reaction after a dose of any tetanus, diphtheria, or pertussis containing vaccine, OR if you have a severe allergy to any part of this vaccine, you should not get Tdap. Tell your doctor if you have any severe allergies.  If you had a coma, or long or multiple seizures within 7 days after a childhood dose of DTP or DTaP, you should not get Tdap, unless a cause other than the vaccine was found. You can still get Td.  Talk to your doctor if you:  have epilepsy or another nervous system problem,  had severe pain or swelling after any vaccine containing diphtheria, tetanus or pertussis,  ever had Guillain-Barr Syndrome (GBS),  aren't feeling well on the day the shot is scheduled. RISKS OF A VACCINE REACTION With any medicine, including vaccines, there is a chance of side effects. These are usually mild and go away on their own, but serious reactions are also possible. Brief fainting spells can follow a vaccination, leading to injuries from falling. Sitting or lying down for about 15 minutes can help prevent these. Tell your doctor if you feel dizzy or light-headed, or   have vision changes or ringing in the ears. Mild problems following Tdap (Did not interfere with activities)  Pain where the shot was given (about 3 in 4 adolescents or 2 in 3 adults)  Redness or swelling where the shot was given (about 1 person in 5)  Mild fever of at least 100.50F (up to about 1 in 25 adolescents or 1 in  100 adults)  Headache (about 3 or 4 people in 10)  Tiredness (about 1 person in 3 or 4)  Nausea, vomiting, diarrhea, stomach ache (up to 1 in 4 adolescents or 1 in 10 adults)  Chills, body aches, sore joints, rash, swollen glands (uncommon) Moderate problems following Tdap (Interfered with activities, but did not require medical attention)  Pain where the shot was given (about 1 in 5 adolescents or 1 in 100 adults)  Redness or swelling where the shot was given (up to about 1 in 16 adolescents or 1 in 25 adults)  Fever over 102F (about 1 in 100 adolescents or 1 in 250 adults)  Headache (about 3 in 20 adolescents or 1 in 10 adults)  Nausea, vomiting, diarrhea, stomach ache (up to 1 or 3 people in 100)  Swelling of the entire arm where the shot was given (up to about 3 in 100). Severe problems following Tdap (Unable to perform usual activities, required medical attention)  Swelling, severe pain, bleeding and redness in the arm where the shot was given (rare). A severe allergic reaction could occur after any vaccine (estimated less than 1 in a million doses). WHAT IF THERE IS A SERIOUS REACTION? What should I look for?  Look for anything that concerns you, such as signs of a severe allergic reaction, very high fever, or behavior changes. Signs of a severe allergic reaction can include hives, swelling of the face and throat, difficulty breathing, a fast heartbeat, dizziness, and weakness. These would start a few minutes to a few hours after the vaccination. What should I do?  If you think it is a severe allergic reaction or other emergency that can't wait, call 9-1-1 or get the person to the nearest hospital. Otherwise, call your doctor.  Afterward, the reaction should be reported to the "Vaccine Adverse Event Reporting System" (VAERS). Your doctor might file this report, or you can do it yourself through the VAERS web site at www.vaers.LAgents.no, or by calling 1-4696204419. VAERS is  only for reporting reactions. They do not give medical advice.  THE NATIONAL VACCINE INJURY COMPENSATION PROGRAM The National Vaccine Injury Compensation Program (VICP) is a federal program that was created to compensate people who may have been injured by certain vaccines. Persons who believe they may have been injured by a vaccine can learn about the program and about filing a claim by calling 1-769-186-6426 or visiting the VICP website at SpiritualWord.at. HOW CAN I LEARN MORE?  Ask your doctor.  Call your local or state health department.  Contact the Centers for Disease Control and Prevention (CDC):  Call (678)745-1238 or visit CDC's website at PicCapture.uy CDC Tdap Vaccine VIS (09/17/11) Document Released: 10/27/2011 Document Reviewed: 10/27/2011 [facility] Patient Information 2013 [place], [place].                                            Patient information: High cholesterol (The Basics)  What is cholesterol? - Cholesterol is a substance that is found in the blood.  Everyone has some. It is needed for good health. The problem is, people sometimes have too much cholesterol. Compared with people with normal cholesterol, people with high cholesterol have a higher risk of heart attacks, strokes, and other health problems. The higher your cholesterol, the higher your risk of these problems. Cholesterol levels in your body are determined significantly by your diet. Cholesterol levels may also be related to heart disease. The following material helps to explain this relationship and discusses what you can do to help keep your heart healthy. Not all cholesterol is bad. Low-density lipoprotein (LDL) cholesterol is the "bad" cholesterol. It may cause fatty deposits to build up inside your arteries. High-density lipoprotein (HDL) cholesterol is "good." It helps to remove the "bad" LDL cholesterol from your blood. Cholesterol is a very important risk factor for heart disease.  Other risk factors are high blood pressure, smoking, stress, heredity, and weight.  The heart muscle gets its supply of blood through the coronary arteries. If your LDL cholesterol is high and your HDL cholesterol is low, you are at risk for having fatty deposits build up in your coronary arteries. This leaves less room through which blood can flow. Without sufficient blood and oxygen, the heart muscle cannot function properly and you may feel chest pains (angina pectoris). When a coronary artery closes up entirely, a part of the heart muscle may die, causing a heart attack (myocardial infarction).  CHECKING CHOLESTEROL When your caregiver sends your blood to a lab to be analyzed for cholesterol, a complete lipid (fat) profile may be done. With this test, the total amount of cholesterol and levels of LDL and HDL are determined. Triglycerides are a type of fat that circulates in the blood and can also be used to determine heart disease risk. Are there different types of cholesterol? - Yes, there are a few different types. If you get a cholesterol test, you may hear your doctor or nurse talk about: Total cholesterol  LDL cholesterol - Some people call this the "bad" cholesterol. That's because having high LDL levels raises your risk of heart attacks, strokes, and other health problems.  HDL cholesterol - Some people call this the "good" cholesterol. That's because having high HDL levels lowers your risk of heart attacks, strokes, and other health problems.  Non-HDL cholesterol - Non-HDL cholesterol is your total cholesterol minus your HDL cholesterol.  Triglycerides - Triglycerides are not cholesterol. They are a type of fat. But they often get measured when cholesterol is measured. (Having high triglycerides also seems to increase the risk of heart attacks and strokes.)   Keep in mind, though, that many people who cannot meet these goals still have a low risk of heart attacks and strokes. What should I  do if my doctor tells me I have high cholesterol? - Ask your doctor what your overall risk of heart attacks and strokes is. High cholesterol, by itself, is not always a reason to worry. Having high cholesterol is just one of many things that can increase your risk of heart attacks and strokes. Other factors that increase your risk include:  Cigarette smoking  High blood pressure  Having a parent, sister, or brother who got heart disease at a young age (Young, in this case, means younger than 59 for men and younger than 58 for women.)  Being a man (Women are at risk, too, but men have a higher risk.)  Older age  If you are at high risk of heart attacks and strokes, having  high cholesterol is a problem. On the other hand, if you have are at low risk, having high cholesterol may not mean much. Should I take medicine to lower cholesterol? - Not everyone who has high cholesterol needs medicines. Your doctor or nurse will decide if you need them based on your age, family history, and other health concerns.  You should probably take a cholesterol-lowering medicine called a statin if you: Already had a heart attack or stroke  Have known heart disease  Have diabetes  Have a condition called peripheral artery disease, which makes it painful to walk, and happens when the arteries in your legs get clogged with fatty deposits  Have an abdominal aortic aneurysm, which is a widening of the main artery in the belly  Most people with any of the conditions listed above should take a statin no matter what their cholesterol level is. If your doctor or nurse puts you on a statin, stay on it. The medicine may not make you feel any different. But it can help prevent heart attacks, strokes, and death.  Can I lower my cholesterol without medicines? - Yes, you can lower your cholesterol some by:  Avoiding red meat, butter, fried foods, cheese, and other foods that have a lot of saturated fat  Losing weight (if you are  overweight)  Being more active Even if these steps do little to change your cholesterol, they can improve your health in many ways.                                                   Cholesterol Control Diet  CONTROLLING CHOLESTEROL WITH DIET Although exercise and lifestyle factors are important, your diet is key. That is because certain foods are known to raise cholesterol and others to lower it. The goal is to balance foods for their effect on cholesterol and more importantly, to replace saturated and trans fat with other types of fat, such as monounsaturated fat, polyunsaturated fat, and omega-3 fatty acids. On average, a person should consume no more than 15 to 17 g of saturated fat daily. Saturated and trans fats are considered "bad" fats, and they will raise LDL cholesterol. Saturated fats are primarily found in animal products such as meats, butter, and cream. However, that does not mean you need to sacrifice all your favorite foods. Today, there are good tasting, low-fat, low-cholesterol substitutes for most of the things you like to eat. Choose low-fat or nonfat alternatives. Choose round or loin cuts of red meat, since these types of cuts are lowest in fat and cholesterol. Chicken (without the skin), fish, veal, and ground [place] breast are excellent choices. Eliminate fatty meats, such as hot dogs and salami. Even shellfish have little or no saturated fat. Have a 3 oz (85 g) portion when you eat lean meat, poultry, or fish. Trans fats are also called "partially hydrogenated oils." They are oils that have been scientifically manipulated so that they are solid at room temperature resulting in a longer shelf life and improved taste and texture of foods in which they are added. Trans fats are found in stick margarine, some tub margarines, cookies, crackers, and baked goods.  When baking and cooking, oils are an excellent substitute for butter. The monounsaturated oils are especially beneficial since  it is believed they lower LDL and raise HDL. The oils  you should avoid entirely are saturated tropical oils, such as coconut and palm.  Remember to eat liberally from food groups that are naturally free of saturated and trans fat, including fish, fruit, vegetables, beans, grains (barley, rice, couscous, bulgur wheat), and pasta (without cream sauces).  IDENTIFYING FOODS THAT LOWER CHOLESTEROL  Soluble fiber may lower your cholesterol. This type of fiber is found in fruits such as apples, vegetables such as broccoli, potatoes, and carrots, legumes such as beans, peas, and lentils, and grains such as barley. Foods fortified with plant sterols (phytosterol) may also lower cholesterol. You should eat at least 2 g per day of these foods for a cholesterol lowering effect.  Read package labels to identify low-saturated fats, trans fats free, and low-fat foods at the supermarket. Select cheeses that have only 2 to 3 g saturated fat per ounce. Use a heart-healthy tub margarine that is free of trans fats or partially hydrogenated oil. When buying baked goods (cookies, crackers), avoid partially hydrogenated oils. Breads and muffins should be made from whole grains (whole-wheat or whole oat flour, instead of "flour" or "enriched flour"). Buy non-creamy canned soups with reduced salt and no added fats.  FOOD PREPARATION TECHNIQUES  Never deep-fry. If you must fry, either stir-fry, which uses very little fat, or use non-stick cooking sprays. When possible, broil, bake, or roast meats, and steam vegetables. Instead of dressing vegetables with butter or margarine, use lemon and herbs, applesauce and cinnamon (for squash and sweet potatoes), nonfat yogurt, salsa, and low-fat dressings for salads.  LOW-SATURATED FAT / LOW-FAT FOOD SUBSTITUTES Meats / Saturated Fat (g)  Avoid: Steak, marbled (3 oz/85 g) / 11 g   Choose: Steak, lean (3 oz/85 g) / 4 g   Avoid: Hamburger (3 oz/85 g) / 7 g   Choose: Hamburger, lean (3  oz/85 g) / 5 g   Avoid: Ham (3 oz/85 g) / 6 g   Choose: Ham, lean cut (3 oz/85 g) / 2.4 g   Avoid: Chicken, with skin, dark meat (3 oz/85 g) / 4 g   Choose: Chicken, skin removed, dark meat (3 oz/85 g) / 2 g   Avoid: Chicken, with skin, light meat (3 oz/85 g) / 2.5 g   Choose: Chicken, skin removed, light meat (3 oz/85 g) / 1 g  Dairy / Saturated Fat (g)  Avoid: Whole milk (1 cup) / 5 g   Choose: Low-fat milk, 2% (1 cup) / 3 g   Choose: Low-fat milk, 1% (1 cup) / 1.5 g   Choose: Skim milk (1 cup) / 0.3 g   Avoid: Hard cheese (1 oz/28 g) / 6 g   Choose: Skim milk cheese (1 oz/28 g) / 2 to 3 g   Avoid: Cottage cheese, 4% fat (1 cup) / 6.5 g   Choose: Low-fat cottage cheese, 1% fat (1 cup) / 1.5 g   Avoid: Ice cream (1 cup) / 9 g   Choose: Sherbet (1 cup) / 2.5 g   Choose: Nonfat frozen yogurt (1 cup) / 0.3 g   Choose: Frozen fruit bar / trace   Avoid: Whipped cream (1 tbs) / 3.5 g   Choose: Nondairy whipped topping (1 tbs) / 1 g  Condiments / Saturated Fat (g)  Avoid: Mayonnaise (1 tbs) / 2 g   Choose: Low-fat mayonnaise (1 tbs) / 1 g   Avoid: Butter (1 tbs) / 7 g   Choose: Extra light margarine (1 tbs) / 1 g   Avoid: Coconut  oil (1 tbs) / 11.8 g   Choose: Olive oil (1 tbs) / 1.8 g   Choose: Corn oil (1 tbs) / 1.7 g   Choose: Safflower oil (1 tbs) / 1.2 g   Choose: Sunflower oil (1 tbs) / 1.4 g   Choose: Soybean oil (1 tbs) / 2.4 g   Choose: Canola oil (1 tbs) / 1 g  Exercise to Lose Weight Exercise and a healthy diet may help you lose weight. Your doctor may suggest specific exercises. EXERCISE IDEAS AND TIPS Choose low-cost things you enjoy doing, such as walking, bicycling, or exercising to workout videos.  Take stairs instead of the elevator.  Walk during your lunch break.  Park your car further away from work or school.  Go to a gym or an exercise class.  Start with 5 to 10 minutes of exercise each day. Build up to 30 minutes of exercise 4  to 6 days a week.  Wear shoes with good support and comfortable clothes.  Stretch before and after working out.  Work out until you breathe harder and your heart beats faster.  Drink extra water when you exercise.  Do not do so much that you hurt yourself, feel dizzy, or get very short of breath.  Exercises that burn about 150 calories: Running 1  miles in 15 minutes.  Playing volleyball for 45 to 60 minutes.  Washing and waxing a car for 45 to 60 minutes.  Playing touch football for 45 minutes.  Walking 1  miles in 35 minutes.  Pushing a stroller 1  miles in 30 minutes.  Playing basketball for 30 minutes.  Raking leaves for 30 minutes.  Bicycling 5 miles in 30 minutes.  Walking 2 miles in 30 minutes.  Dancing for 30 minutes.  Shoveling snow for 15 minutes.  Swimming laps for 20 minutes.  Walking up stairs for 15 minutes.  Bicycling 4 miles in 15 minutes.  Gardening for 30 to 45 minutes.  Jumping rope for 15 minutes.  Washing windows or floors for 45 to 60 minutes.  Document Released: 05/30/2010 Document Revised: 01/07/2011 Document Reviewed: 05/30/2010 [facility] Patient Information 2012 [place], [place].

## 2012-08-20 LAB — URINALYSIS W MICROSCOPIC + REFLEX CULTURE
Casts: NONE SEEN
Hgb urine dipstick: NEGATIVE
Leukocytes, UA: NEGATIVE
Nitrite: NEGATIVE
Urobilinogen, UA: 0.2 mg/dL (ref 0.0–1.0)
pH: 5.5 (ref 5.0–8.0)

## 2012-08-21 LAB — URINE CULTURE: Colony Count: 7000

## 2012-09-23 ENCOUNTER — Other Ambulatory Visit: Payer: Self-pay

## 2012-09-23 DIAGNOSIS — Z1231 Encounter for screening mammogram for malignant neoplasm of breast: Secondary | ICD-10-CM

## 2012-10-31 ENCOUNTER — Ambulatory Visit
Admission: RE | Admit: 2012-10-31 | Discharge: 2012-10-31 | Disposition: A | Discharge status: Home / Self Care | Payer: Managed Care, Other (non HMO) | Source: Ambulatory Visit

## 2012-10-31 DIAGNOSIS — Z1231 Encounter for screening mammogram for malignant neoplasm of breast: Secondary | ICD-10-CM

## 2012-12-02 ENCOUNTER — Ambulatory Visit: Payer: Managed Care, Other (non HMO) | Admitting: Gynecology

## 2013-01-13 ENCOUNTER — Ambulatory Visit: Payer: Managed Care, Other (non HMO) | Admitting: General Practice

## 2013-03-27 ENCOUNTER — Encounter: Payer: Self-pay | Admitting: Internal Medicine

## 2013-03-27 ENCOUNTER — Encounter: Payer: Self-pay | Admitting: *Deleted

## 2013-03-31 ENCOUNTER — Ambulatory Visit: Payer: Managed Care, Other (non HMO) | Admitting: Internal Medicine

## 2013-04-11 ENCOUNTER — Ambulatory Visit: Payer: Managed Care, Other (non HMO) | Admitting: Internal Medicine

## 2013-04-28 ENCOUNTER — Encounter: Payer: Self-pay | Admitting: Internal Medicine

## 2013-04-28 ENCOUNTER — Ambulatory Visit (INDEPENDENT_AMBULATORY_CARE_PROVIDER_SITE_OTHER): Payer: Managed Care, Other (non HMO) | Admitting: Internal Medicine

## 2013-04-28 VITALS — BP 132/96 | HR 51 | Ht 63.0 in | Wt 196.1 lb

## 2013-04-28 DIAGNOSIS — Z13220 Encounter for screening for lipoid disorders: Secondary | ICD-10-CM

## 2013-04-28 DIAGNOSIS — D649 Anemia, unspecified: Secondary | ICD-10-CM

## 2013-04-28 DIAGNOSIS — Z139 Encounter for screening, unspecified: Secondary | ICD-10-CM

## 2013-04-28 DIAGNOSIS — I079 Rheumatic tricuspid valve disease, unspecified: Secondary | ICD-10-CM | POA: Insufficient documentation

## 2013-04-28 DIAGNOSIS — Z8249 Family history of ischemic heart disease and other diseases of the circulatory system: Secondary | ICD-10-CM | POA: Insufficient documentation

## 2013-04-28 LAB — COMPREHENSIVE METABOLIC PANEL
BUN: 10 mg/dL (ref 6–23)
CO2: 25 mEq/L (ref 19–32)
Calcium: 9.1 mg/dL (ref 8.4–10.5)
Creat: 0.93 mg/dL (ref 0.50–1.10)
Glucose, Bld: 86 mg/dL (ref 70–99)
Total Bilirubin: 0.4 mg/dL (ref 0.3–1.2)

## 2013-04-28 LAB — LIPID PANEL
Cholesterol: 178 mg/dL (ref 0–200)
HDL: 64 mg/dL (ref >39)
LDL Cholesterol: 95 mg/dL (ref 0–99)
Triglycerides: 94 mg/dL (ref <150)

## 2013-04-28 NOTE — Progress Notes (Signed)
OFFICE NOTE  Chief Complaint:  No complaints  Primary Care Physician: Redmond Baseman, MD  HPI:  Colleen Gonzalez is a pleasant 47 year old female presented followed by Dr. Jeannie Fend. and troponin was establishing care with me today. I'm also taking care of her mother who has coronary disease. She has been followed by Dr. Alanda Amass for a number of years and had an echocardiogram last in 2011 to evaluate the murmur. This demonstrated mild to moderate tricuspid regurgitation and a very small pericardial effusion. She was really not having symptoms at that time. She currently denies any shortness of breath or chest pain. She's not on any medications. Her last lipid profile was one year ago which showed an excellent total cholesterol 161 and LDL of 91. Before so she's had about 10-12 pound weight gain since her last office visit. Her exercises tailed off some and she wishes to start that again.  PMHx:  Past Medical History  Diagnosis Date  . Acne     Past Surgical History  Procedure Laterality Date  . Pelvic laparoscopy  10/30/1993    MYOMECTOMY, LYSIS OF ADHESIONS..  . Transthoracic echocardiogram  01/03/2010    EF=>55%; mild-mod TR; trace AV regurg     FAMHx:  Family History  Problem Relation Age of Onset  . Heart disease Mother     ANGINA; MI @ 74  . Diabetes Father     HTN   . Breast cancer Paternal Aunt 30  . Hypertension Maternal Grandmother   . Cancer Maternal Grandmother     UTERINE    SOCHx:   reports that she has never smoked. She has never used smokeless tobacco. She reports that she drinks alcohol. She reports that she does not use illicit drugs.  ALLERGIES:  No Known Allergies  ROS: A comprehensive review of systems was negative except for: Constitutional: positive for weight gain  HOME MEDS: Current Outpatient Prescriptions  Medication Sig Dispense Refill  . ferrous sulfate 325 (65 FE) MG tablet Take 325 mg by mouth daily with breakfast.      .  norethindrone-ethinyl estradiol (JUNEL FE,GILDESS FE,LOESTRIN FE) 1-20 MG-MCG tablet Take one tablet each day active pills only  1 Package  11   No current facility-administered medications for this visit.    LABS/IMAGING: No results found for this or any previous visit (from the past 48 hour(s)). No results found.  VITALS: BP 132/96  Pulse 51  Ht 5\' 3"  (1.6 m)  Wt 196 lb 1.6 oz (88.95 kg)  BMI 34.75 kg/m2  EXAM: General appearance: alert and no distress Neck: no carotid bruit and no JVD Lungs: clear to auscultation bilaterally Heart: irregularly irregular rhythm, S1, S2 normal and systolic murmur: early systolic 2/6, crescendo at 2nd right intercostal space Abdomen: soft, non-tender; bowel sounds normal; no masses,  no organomegaly Extremities: extremities normal, atraumatic, no cyanosis or edema Pulses: 2+ and symmetric Skin: Skin color, texture, turgor normal. No rashes or lesions Neurologic: Grossly normal Psych: Mood, affect normal  EKG: Sinus bradycardia 51  ASSESSMENT: 1. Tricuspid murmur 2. Family history of premature coronary disease  PLAN: 1.   Mrs. Colleen Gonzalez is doing very well except she has had some recent weight gain. Would like to recheck her lipid profile and CMP as it's been one year since her last study. She's not currently on any medications. Her blood pressure looks well controlled and she's had no complaints. Her tricuspid murmur is stable. We will contact her with the results of her lab  work and plan to see her back in a year or sooner as necessary.  Chrystie Nose, MD, [facility] Attending Cardiologist [facility]  Jabes Primo C 04/28/2013, 11:59 AM

## 2013-04-28 NOTE — Patient Instructions (Signed)
Your physician recommends that you return for lab work TODAY - we will call you with the results.  Your physician wants you to follow-up in: 1 year with Dr. Rennis Golden. You will receive a reminder letter in the mail two months in advance. If you don't receive a letter, please call our office to schedule the follow-up appointment.

## 2013-05-08 ENCOUNTER — Encounter: Payer: Self-pay | Admitting: *Deleted

## 2013-05-29 ENCOUNTER — Encounter: Payer: Self-pay | Admitting: General Practice

## 2013-05-29 ENCOUNTER — Telehealth: Payer: Self-pay | Admitting: Nurse Practitioner

## 2013-05-29 ENCOUNTER — Ambulatory Visit (INDEPENDENT_AMBULATORY_CARE_PROVIDER_SITE_OTHER): Payer: Managed Care, Other (non HMO) | Admitting: General Practice

## 2013-05-29 ENCOUNTER — Encounter (INDEPENDENT_AMBULATORY_CARE_PROVIDER_SITE_OTHER): Payer: Self-pay

## 2013-05-29 VITALS — BP 94/50 | HR 68 | Temp 98.8°F | Ht 63.0 in | Wt 196.5 lb

## 2013-05-29 DIAGNOSIS — J329 Chronic sinusitis, unspecified: Secondary | ICD-10-CM

## 2013-05-29 DIAGNOSIS — R059 Cough, unspecified: Secondary | ICD-10-CM

## 2013-05-29 DIAGNOSIS — R05 Cough: Secondary | ICD-10-CM

## 2013-05-29 MED ORDER — GUAIFENESIN-CODEINE 100-10 MG/5ML PO SYRP: 5.0000 mL | ORAL_SOLUTION | Freq: Three times a day (TID) | ORAL | Status: DC | PRN

## 2013-05-29 MED ORDER — AZITHROMYCIN 250 MG PO TABS: ORAL_TABLET | ORAL | Status: DC

## 2013-05-29 MED ORDER — METHYLPREDNISOLONE ACETATE 80 MG/ML IJ SUSP
80.0000 mg | Freq: Once | INTRAMUSCULAR | Status: AC
  Administered 2013-05-29: 80 mg via INTRAMUSCULAR

## 2013-05-29 NOTE — Patient Instructions (Signed)

## 2013-05-29 NOTE — Telephone Encounter (Signed)
PT HAS PROD COUGH AND NTBS APPT SCHEDULED

## 2013-05-29 NOTE — Progress Notes (Signed)
   Subjective:    Patient ID: Colleen Gonzalez, female    DOB: 12-14-1965, 48 y.o.   MRN: [id number]  Sinusitis This is a new problem. The current episode started in the past 7 days. The problem has been gradually worsening since onset. There has been no fever. Associated symptoms include congestion, coughing and sinus pressure. Pertinent negatives include no chills, headaches, hoarse voice, shortness of breath or sore throat. Past treatments include acetaminophen and oral decongestants. The treatment provided mild relief.      Review of Systems  Constitutional: Negative for fever and chills.  HENT: Positive for congestion, postnasal drip and sinus pressure. Negative for hoarse voice and sore throat.   Respiratory: Positive for cough. Negative for chest tightness and shortness of breath.   Cardiovascular: Negative for chest pain and palpitations.  Genitourinary: Negative for difficulty urinating.  Neurological: Negative for dizziness, weakness and headaches.  All other systems reviewed and are negative.       Objective:   Physical Exam  Constitutional: She is oriented to person, place, and time. She appears well-developed and well-nourished.  HENT:  Head: Normocephalic and atraumatic.  Right Ear: External ear normal.  Left Ear: External ear normal.  Eyes: Pupils are equal, round, and reactive to light.  Neck: Normal range of motion.  Cardiovascular: Normal rate, regular rhythm and normal heart sounds.   Pulmonary/Chest: Effort normal and breath sounds normal. No respiratory distress. She exhibits no tenderness.  Dry  cough  Neurological: She is alert and oriented to person, place, and time.  Skin: Skin is warm and dry.  Psychiatric: She has a normal mood and affect.          Assessment & Plan:  1. Sinusitis  - azithromycin (ZITHROMAX) 250 MG tablet; Take as directed  Dispense: 6 tablet; Refill: 0  2. Cough - methylPREDNISolone acetate (DEPO-MEDROL) injection 80 mg;  Inject 1 mL (80 mg total) into the muscle once. - guaiFENesin-codeine (CHERATUSSIN AC) 100-10 MG/5ML syrup; Take 5 mLs by mouth 3 (three) times daily as needed for cough.  Dispense: 120 mL; Refill: 0 -sedation precaution discussed -increase fluids -rest -RTO if symptoms worsen or unresolved -may seek emergency medical treatment Patient verbalized understanding Erby Pian, FNP-C

## 2013-06-15 ENCOUNTER — Other Ambulatory Visit: Payer: Self-pay | Admitting: *Deleted

## 2013-06-15 MED ORDER — METRONIDAZOLE 500 MG PO TABS: 500.0000 mg | ORAL_TABLET | Freq: Two times a day (BID) | ORAL | Status: DC

## 2013-06-16 ENCOUNTER — Encounter: Payer: Self-pay | Admitting: Obstetrics

## 2013-07-10 ENCOUNTER — Ambulatory Visit (INDEPENDENT_AMBULATORY_CARE_PROVIDER_SITE_OTHER): Payer: Managed Care, Other (non HMO) | Admitting: Nurse Practitioner

## 2013-07-10 VITALS — BP 137/91 | HR 67 | Temp 100.1°F | Ht 63.0 in | Wt 190.0 lb

## 2013-07-10 DIAGNOSIS — R059 Cough, unspecified: Secondary | ICD-10-CM

## 2013-07-10 DIAGNOSIS — R05 Cough: Secondary | ICD-10-CM

## 2013-07-10 DIAGNOSIS — R509 Fever, unspecified: Secondary | ICD-10-CM

## 2013-07-10 DIAGNOSIS — J069 Acute upper respiratory infection, unspecified: Secondary | ICD-10-CM

## 2013-07-10 LAB — POCT INFLUENZA A/B
Influenza A, POC: NEGATIVE
Influenza B, POC: NEGATIVE

## 2013-07-10 LAB — POCT RAPID STREP A (OFFICE): Rapid Strep A Screen: NEGATIVE

## 2013-07-10 MED ORDER — GUAIFENESIN-CODEINE 100-10 MG/5ML PO SYRP: 5.0000 mL | ORAL_SOLUTION | Freq: Three times a day (TID) | ORAL | Status: DC | PRN

## 2013-07-10 MED ORDER — AMOXICILLIN 875 MG PO TABS: 875.0000 mg | ORAL_TABLET | Freq: Two times a day (BID) | ORAL | Status: DC

## 2013-07-10 NOTE — Patient Instructions (Signed)

## 2013-07-10 NOTE — Progress Notes (Signed)
Subjective:    Patient ID: Colleen Gonzalez, female    DOB: 27-Jan-1966, 48 y.o.   MRN: [id number]  HPI Patient in today c/o cough and congestion- started last Friday- mucinex OTC no help- fever just started this morning.    Review of Systems  Constitutional: Positive for fever and appetite change. Negative for chills.  HENT: Positive for congestion, rhinorrhea, sinus pressure and sore throat. Negative for trouble swallowing.   Respiratory: Positive for cough (productive).   Cardiovascular: Negative.   Gastrointestinal: Negative.   Psychiatric/Behavioral: Negative.   All other systems reviewed and are negative.       Objective:   Physical Exam  Constitutional: She appears well-developed and well-nourished.  HENT:  Right Ear: Hearing, tympanic membrane, external ear and ear canal normal.  Left Ear: Hearing, tympanic membrane, external ear and ear canal normal.  Nose: Mucosal edema and rhinorrhea present. Right sinus exhibits no maxillary sinus tenderness and no frontal sinus tenderness. Left sinus exhibits no maxillary sinus tenderness and no frontal sinus tenderness.  Mouth/Throat: Uvula is midline and mucous membranes are normal.  Eyes: Pupils are equal, round, and reactive to light.  Neck: Normal range of motion. Neck supple.  Cardiovascular: Normal rate, regular rhythm and normal heart sounds.   Pulmonary/Chest: Effort normal and breath sounds normal.  Lymphadenopathy:    She has no cervical adenopathy.  Skin: Skin is warm and dry.  Psychiatric: She has a normal mood and affect. Her behavior is normal. Judgment and thought content normal.   BP 137/91  Pulse 67  Temp(Src) 100.1 F (37.8 C) (Oral)  Ht 5\' 3"  (1.6 m)  Wt 190 lb (86.183 kg)  BMI 33.67 kg/m2  Results for orders placed in visit on 07/10/13  POCT RAPID STREP A (OFFICE)      Result Value Ref Range   Rapid Strep A Screen Negative  Negative  POCT INFLUENZA A/B      Result Value Ref Range   Influenza A, POC  Negative     Influenza B, POC Negative           Assessment & Plan:   1. Fever   2. Cough   3. Upper respiratory infection    Meds ordered this encounter  Medications  . amoxicillin (AMOXIL) 875 MG tablet    Sig: Take 1 tablet (875 mg total) by mouth 2 (two) times daily.    Dispense:  20 tablet    Refill:  0    Order Specific Question:  Supervising Provider    Answer:  Chipper Herb [[id number]]  . guaiFENesin-codeine (CHERATUSSIN AC) 100-10 MG/5ML syrup    Sig: Take 5 mLs by mouth 3 (three) times daily as needed for cough.    Dispense:  120 mL    Refill:  0    Order Specific Question:  Supervising Provider    Answer:  Chipper Herb [[id number]]   1. Take meds as prescribed 2. Use a cool mist humidifier especially during the winter months and when heat has been humid. 3. Use saline nose sprays frequently 4. Saline irrigations of the nose can be very helpful if done frequently.  * 4X daily for 1 week*  * Use of a nettie pot can be helpful with this. Follow directions with this* 5. Drink plenty of fluids 6. Keep thermostat turn down low 7.For any cough or congestion  Use plain Mucinex- regular strength or max strength is fine   * Children- consult with Pharmacist for dosing 8.  For fever or aces or pains- take tylenol or ibuprofen appropriate for age and weight.  * for fevers greater than 101 orally you may alternate ibuprofen and tylenol every  3 hours.   Mary-Margaret Hassell Done, FNP

## 2013-07-17 ENCOUNTER — Ambulatory Visit: Payer: Managed Care, Other (non HMO) | Admitting: Family Medicine

## 2013-07-20 ENCOUNTER — Ambulatory Visit (INDEPENDENT_AMBULATORY_CARE_PROVIDER_SITE_OTHER): Payer: Managed Care, Other (non HMO) | Admitting: Family Medicine

## 2013-07-20 ENCOUNTER — Encounter: Payer: Self-pay | Admitting: Family Medicine

## 2013-07-20 ENCOUNTER — Telehealth: Payer: Self-pay | Admitting: Nurse Practitioner

## 2013-07-20 VITALS — BP 104/73 | HR 62 | Temp 98.9°F | Wt 196.6 lb

## 2013-07-20 DIAGNOSIS — J209 Acute bronchitis, unspecified: Secondary | ICD-10-CM

## 2013-07-20 MED ORDER — HYDROCODONE-HOMATROPINE 5-1.5 MG/5ML PO SYRP: 5.0000 mL | ORAL_SOLUTION | Freq: Three times a day (TID) | ORAL | Status: DC | PRN

## 2013-07-20 MED ORDER — AZITHROMYCIN 250 MG PO TABS: ORAL_TABLET | ORAL | Status: DC

## 2013-07-20 MED ORDER — TRIAMCINOLONE ACETONIDE 40 MG/ML IJ SUSP
60.0000 mg | Freq: Once | INTRAMUSCULAR | Status: AC
  Administered 2013-07-20: 60 mg via INTRAMUSCULAR

## 2013-07-20 NOTE — Telephone Encounter (Signed)
appt given for 4:15 today with Rush Landmark

## 2013-07-20 NOTE — Progress Notes (Signed)
   Subjective:    Patient ID: Colleen Gonzalez, female    DOB: 02-03-1966, 48 y.o.   MRN: [id number]  HPI  This 48 y.o. female presents for evaluation of URI and cough that is persistent and keeps her Up at night.  Review of Systems C/o cough and uri sx's   No chest pain, SOB, HA, dizziness, vision change, N/V, diarrhea, constipation, dysuria, urinary urgency or frequency, myalgias, arthralgias or rash.  Objective:   Physical Exam Vital signs noted  Well developed well nourished female.  HEENT - Head atraumatic Normocephalic                Eyes - PERRLA, Conjuctiva - clear Sclera- Clear EOMI                Ears - EAC's Wnl TM's Wnl Gross Hearing WNL                Nose - Nares patent                 Throat - oropharanx wnl Respiratory - Lungs CTA bilateral Cardiac - RRR S1 and S2 without murmur GI - Abdomen soft Nontender and bowel sounds active x 4 Extremities - No edema. Neuro - Grossly intact.       Assessment & Plan:  Acute bronchitis - Plan: HYDROcodone-homatropine (HYCODAN) 5-1.5 MG/5ML syrup, azithromycin (ZITHROMAX) 250 MG tablet, triamcinolone acetonide (KENALOG-40) injection 60 mg Push po fluids, rest, tylenol and motrin otc prn as directed for fever, arthralgias, and myalgias.  Follow up prn if sx's continue or persist.  Lysbeth Penner FNP

## 2013-07-20 NOTE — Telephone Encounter (Signed)
ntbs if no better

## 2013-08-01 ENCOUNTER — Other Ambulatory Visit: Payer: Self-pay | Admitting: Gynecology

## 2013-08-25 ENCOUNTER — Other Ambulatory Visit (HOSPITAL_COMMUNITY)
Admission: RE | Admit: 2013-08-25 | Discharge: 2013-08-25 | Disposition: A | Discharge status: Home / Self Care | Payer: Managed Care, Other (non HMO) | Source: Ambulatory Visit | Attending: Gynecology | Admitting: Gynecology

## 2013-08-25 ENCOUNTER — Encounter: Payer: Self-pay | Admitting: Gynecology

## 2013-08-25 ENCOUNTER — Ambulatory Visit (INDEPENDENT_AMBULATORY_CARE_PROVIDER_SITE_OTHER): Payer: Managed Care, Other (non HMO) | Admitting: Gynecology

## 2013-08-25 VITALS — BP 130/80 | Ht 63.5 in | Wt 191.0 lb

## 2013-08-25 DIAGNOSIS — Z1151 Encounter for screening for human papillomavirus (HPV): Secondary | ICD-10-CM | POA: Insufficient documentation

## 2013-08-25 DIAGNOSIS — Z01419 Encounter for gynecological examination (general) (routine) without abnormal findings: Secondary | ICD-10-CM | POA: Insufficient documentation

## 2013-08-25 DIAGNOSIS — D259 Leiomyoma of uterus, unspecified: Secondary | ICD-10-CM

## 2013-08-25 LAB — CBC WITH DIFFERENTIAL/PLATELET
BASOS PCT: 0 % (ref 0–1)
Basophils Absolute: 0 10*3/uL (ref 0.0–0.1)
EOS PCT: 5 % (ref 0–5)
Eosinophils Absolute: 0.2 10*3/uL (ref 0.0–0.7)
HEMATOCRIT: 33.4 % — AB (ref 36.0–46.0)
HEMOGLOBIN: 10.8 g/dL — AB (ref 12.0–15.0)
Lymphocytes Relative: 44 % (ref 12–46)
Lymphs Abs: 2.1 10*3/uL (ref 0.7–4.0)
MCH: 27.6 pg (ref 26.0–34.0)
MCHC: 32.3 g/dL (ref 30.0–36.0)
MCV: 85.4 fL (ref 78.0–100.0)
MONO ABS: 0.3 10*3/uL (ref 0.1–1.0)
MONOS PCT: 7 % (ref 3–12)
NEUTROS ABS: 2.1 10*3/uL (ref 1.7–7.7)
Neutrophils Relative %: 44 % (ref 43–77)
Platelets: 392 10*3/uL (ref 150–400)
RBC: 3.91 MIL/uL (ref 3.87–5.11)
RDW: 14.7 % (ref 11.5–15.5)
WBC: 4.8 10*3/uL (ref 4.0–10.5)

## 2013-08-25 LAB — COMPREHENSIVE METABOLIC PANEL
ALBUMIN: 4.1 g/dL (ref 3.5–5.2)
ALT: 15 U/L (ref 0–35)
AST: 11 U/L (ref 0–37)
Alkaline Phosphatase: 76 U/L (ref 39–117)
BUN: 14 mg/dL (ref 6–23)
CALCIUM: 9 mg/dL (ref 8.4–10.5)
CHLORIDE: 107 meq/L (ref 96–112)
CO2: 26 mEq/L (ref 19–32)
Creat: 1.09 mg/dL (ref 0.50–1.10)
Glucose, Bld: 88 mg/dL (ref 70–99)
POTASSIUM: 4.3 meq/L (ref 3.5–5.3)
Sodium: 140 mEq/L (ref 135–145)
Total Bilirubin: 0.4 mg/dL (ref 0.2–1.2)
Total Protein: 6.8 g/dL (ref 6.0–8.3)

## 2013-08-25 LAB — LIPID PANEL
Cholesterol: 168 mg/dL (ref 0–200)
HDL: 63 mg/dL (ref >39)
LDL CALC: 94 mg/dL (ref 0–99)
TRIGLYCERIDES: 53 mg/dL (ref <150)
Total CHOL/HDL Ratio: 2.7 Ratio
VLDL: 11 mg/dL (ref 0–40)

## 2013-08-25 LAB — TSH: TSH: 1.247 u[IU]/mL (ref 0.350–4.500)

## 2013-08-25 MED ORDER — NORETHIN ACE-ETH ESTRAD-FE 1-20 MG-MCG PO TABS: 1.0000 | ORAL_TABLET | Freq: Every day | ORAL | Status: DC

## 2013-08-25 NOTE — Patient Instructions (Signed)
Uterine Fibroid A uterine fibroid is a growth (tumor) that occurs in your uterus. This type of tumor is not cancerous and does not spread out of the uterus. You can have one or many fibroids. Fibroids can vary in size, weight, and where they grow in the uterus. Some can become quite large. Most fibroids do not require medical treatment, but some can cause pain or heavy bleeding during and between periods. CAUSES  A fibroid is the result of a single uterine cell that keeps growing (unregulated), which is different than most cells in the human body. Most cells have a control mechanism that keeps them from reproducing without control.  SIGNS AND SYMPTOMS   Bleeding.  Pelvic pain and pressure.  Bladder problems due to the size of the fibroid.  Infertility and miscarriages depending on the size and location of the fibroid. DIAGNOSIS  Uterine fibroids are diagnosed through a physical exam. Your health care provider may feel the lumpy tumors during a pelvic exam. Ultrasonography may be done to get information regarding size, location, and number of tumors.  TREATMENT   Your health care provider may recommend watchful waiting. This involves getting the fibroid checked by your health care provider to see if it grows or shrinks.   Hormone treatment or an intrauterine device (IUD) may be prescribed.   Surgery may be needed to remove the fibroids (myomectomy) or the uterus (hysterectomy). This depends on your situation. When fibroids interfere with fertility and a woman wants to become pregnant, a health care provider may recommend having the fibroids removed.  [place] care depends on how you were treated. In general:   Keep all follow-up appointments with your health care provider.   Only take over-the-counter or prescription medicines as directed by your health care provider. If you were prescribed a hormone treatment, take the hormone medicines exactly as directed. Do not  take aspirin. It can cause bleeding.   Talk to your health care provider about taking iron pills.  If your periods are troublesome but not so heavy, lie down with your feet raised slightly above your heart. Place cold packs on your lower abdomen.   If your periods are heavy, write down the number of pads or tampons you use per month. Bring this information to your health care provider.   Include green vegetables in your diet.  SEEK IMMEDIATE MEDICAL CARE IF:  You have pelvic pain or cramps not controlled with medicines.   You have a sudden increase in pelvic pain.   You have an increase in bleeding between and during periods.   You have excessive periods and soak tampons or pads in a half hour or less.  You feel lightheaded or have fainting episodes. Document Released: 04/24/2000 Document Revised: 02/15/2013 Document Reviewed: 11/24/2012 [facility] Patient Information 2014 [place], [place]. Transvaginal Ultrasound Transvaginal ultrasound is a pelvic ultrasound, using a metal probe that is placed in the vagina, to look at a women's female organs. Transvaginal ultrasound is a method of seeing inside the pelvis of a woman. The ultrasound machine sends out sound waves from the transducer (probe). These sound waves bounce off body structures (like an echo) to create a picture. The picture shows up on a monitor. It is called transvaginal because the probe is inserted into the vagina. There should be very little discomfort from the vaginal probe. This test can also be used during pregnancy. Endovaginal ultrasound is another name for a transvaginal ultrasound. In a transabdominal ultrasound, the probe  is placed on the outside of the belly. This method gives pictures that are lower quality than pictures from the transvaginal technique. Transvaginal ultrasound is used to look for problems of the female genital tract. Some such problems include:  Infertility problems.  Congenital (birth defect)  malformations of the uterus and ovaries.  Tumors in the uterus.  Abnormal bleeding.  Ovarian tumors and cysts.  Abscess (inflamed tissue around pus) in the pelvis.  Unexplained abdominal or pelvic pain.  Pelvic infection. DURING PREGNANCY, TRANSVAGINAL ULTRASOUND MAY BE USED TO LOOK AT:  Normal pregnancy.  Ectopic pregnancy (pregnancy outside the uterus).  Fetal heartbeat.  Abnormalities in the pelvis, that are not seen well with transabdominal ultrasound.  Suspected twins or multiples.  Impending miscarriage.  Problems with the cervix (incompetent cervix, not able to stay closed and hold the baby).  When doing an amniocentesis (removing fluid from the pregnancy sac, for testing).  Looking for abnormalities of the baby.  Checking the growth, development, and age of the fetus.  Measuring the amount of fluid in the amniotic sac.  When doing an external version of the baby (moving baby into correct position).  Evaluating the baby for problems in high risk pregnancies (biophysical profile).  Suspected fetal demise (death). Sometimes a special ultrasound method called Saline Infusion Sonography (SIS) is used for a more accurate look at the uterus. Sterile saline (salt water) is injected into the uterus of non-pregnant patients to see the inside of the uterus better. SIS is not used on pregnant women. The vaginal probe can also assist in obtaining biopsies of abnormal areas, in draining fluid from cysts on the ovary, and in finding IUDs (intrauterine device, birth control) that cannot be located. PREPARATION FOR TEST A transvaginal ultrasound is done with the bladder empty. The transabdominal ultrasound is done with your bladder full. You may be asked to drink several glasses of water before that exam. Sometimes, a transabdominal ultrasound is done just after a transvaginal ultrasound, to look at organs in your abdomen. PROCEDURE  You will lie down on a table, with your knees  bent and your feet in foot holders. The probe is covered with a condom. A sterile lubricant is put into the vagina and on the probe. The lubricant helps transmit the sound waves and avoid irritating the vagina. Your caregiver will move the probe inside the vaginal cavity to scan the pelvic structures. A normal test will show a normal pelvis and normal contents. An abnormal test will show abnormalities of the pelvis, placenta, or baby. ABNORMAL RESULTS MAY BE DUE TO:  Growths or tumors in the:  Uterus.  Ovaries.  Vagina.  Other pelvic structures.  Non-cancerous growths of the uterus and ovaries.  Twisting of the ovary, cutting off blood supply to the ovary (ovarian torsion).  Areas of infection, including:  Pelvic inflammatory disease.  Abscess in the pelvis.  Locating an IUD. PROBLEMS FOUND IN PREGNANT WOMEN MAY INCLUDE:  Ectopic pregnancy (pregnancy outside the uterus).  Multiple pregnancies.  Early dilation (opening) of the cervix. This may indicate an incompetent cervix and early delivery.  Impending miscarriage.  Fetal death.  Problems with the placenta, including:  Placenta has grown over the opening of the womb (placenta previa).  Placenta has separated early in the womb (placental abruption).  Placenta grows into the muscle of the uterus (placenta accreta).  Tumors of pregnancy, including gestational trophoblastic disease. This is an abnormal pregnancy, with no fetus. The uterus is filled with many grape-like cysts that  could sometimes be cancerous.  Incorrect position of the fetus (breech, vertex).  Intrauterine fetal growth retardation (IUGR) (poor growth in the womb).  Fetal abnormalities or infection. RISKS AND COMPLICATIONS There are no known risks to the ultrasound procedure. There is no X-ray used when doing an ultrasound. Document Released: 04/08/2004 Document Revised: 07/20/2011 Document Reviewed: 03/27/2009 [facility] Patient Information 2014  [place], [place].

## 2013-08-25 NOTE — Progress Notes (Signed)
Colleen Gonzalez 05-Nov-1965 [id number]   History:    48 y.o.  for annual gyn exam who has no complaints today.Patient is doing well on Junel 1/20 28 day oral contraceptive pill is having normal menstrual cycles. Patient was seen in April 2013 had an ultrasound 2 small fibroids were noted the ovaries are otherwise normal. Patient with past history of laparoscopic myomectomy in 1995. Patient with no prior history of abnormal Pap smears. Patient's father with history of non-insulin-dependent diabetes.   Past medical history,surgical history, family history and social history were all reviewed and documented in the EPIC chart.  Gynecologic History No LMP recorded. Patient is not currently having periods (Reason: Oral contraceptives). Contraception: OCP (estrogen/progesterone) Last Pap: 2012. Results were: normal Last mammogram: 2014. Results were: normal  Obstetric History OB History  Gravida Para Term Preterm AB SAB TAB Ectopic Multiple Living  0                  ROS: A ROS was performed and pertinent positives and negatives are included in the history.  GENERAL: No fevers or chills. HEENT: No change in vision, no earache, sore throat or sinus congestion. NECK: No pain or stiffness. CARDIOVASCULAR: No chest pain or pressure. No palpitations. PULMONARY: No shortness of breath, cough or wheeze. GASTROINTESTINAL: No abdominal pain, nausea, vomiting or diarrhea, melena or bright red blood per rectum. GENITOURINARY: No urinary frequency, urgency, hesitancy or dysuria. MUSCULOSKELETAL: No joint or muscle pain, no back pain, no recent trauma. DERMATOLOGIC: No rash, no itching, no lesions. ENDOCRINE: No polyuria, polydipsia, no heat or cold intolerance. No recent change in weight. HEMATOLOGICAL: No anemia or easy bruising or bleeding. NEUROLOGIC: No headache, seizures, numbness, tingling or weakness. PSYCHIATRIC: No depression, no loss of interest in normal activity or change in sleep pattern.      Exam: chaperone present  BP 130/80  Ht 5' 3.5" (1.613 m)  Wt 191 lb (86.637 kg)  BMI 33.30 kg/m2  Body mass index is 33.3 kg/(m^2).  General appearance : Well developed well nourished female. No acute distress HEENT: Neck supple, trachea midline, no carotid bruits, no thyroidmegaly Lungs: Clear to auscultation, no rhonchi or wheezes, or rib retractions  Heart: Regular rate and rhythm, no murmurs or gallops Breast:Examined in sitting and supine position were symmetrical in appearance, no palpable masses or tenderness,  no skin retraction, no nipple inversion, no nipple discharge, no skin discoloration, no axillary or supraclavicular lymphadenopathy Abdomen: no palpable masses or tenderness, no rebound or guarding Extremities: no edema or skin discoloration or tenderness  Pelvic:  Bartholin, Urethra, Skene Glands: Within normal limits             Vagina: No gross lesions or discharge  Cervix: No gross lesions or discharge  Uterus irregularity noted at the left lateral border of the fundal aspect of the uterus questionable fibroid versus ovarian cyst  Adnexa  see above  Anus and perineum  normal   Rectovaginal  normal sphincter tone without palpated masses or tenderness             Hemoccult not indicated     Assessment/Plan:  48 y.o. female for annual exam with history of uterine fibroids in the past. She'll be scheduled for an ultrasound next week for better assessment of her uterus questionable left ovarian cyst versus enlarging fibroid in the left fundal aspect of the uterus. The patient otherwise been asymptomatic and having normal menstrual cycles on the 20 mcg oral contraceptive pill. Pap  smear was done today along with the following labs: CBC, comprehensive metabolic panel, fasting lipid profile, TSH, and urinalysis. Patient was reminded of the importance of monthly breast exam and schedule her mammogram this June. Also discussed importance of calcium and vitamin D in regular  exercise for osteoporosis prevention.  Note: This dictation was prepared with  Dragon/digital dictation along withSmart phrase technology. Any transcriptional errors that result from this process are unintentional.   Terrance Mass MD, 12:26 PM 08/25/2013

## 2013-08-26 LAB — URINALYSIS W MICROSCOPIC + REFLEX CULTURE
BILIRUBIN URINE: NEGATIVE
Casts: NONE SEEN
Crystals: NONE SEEN
Glucose, UA: NEGATIVE mg/dL
Hgb urine dipstick: NEGATIVE
KETONES UR: NEGATIVE mg/dL
Leukocytes, UA: NEGATIVE
Nitrite: NEGATIVE
PROTEIN: NEGATIVE mg/dL
SQUAMOUS EPITHELIAL / LPF: NONE SEEN
Specific Gravity, Urine: 1.022 (ref 1.005–1.030)
UROBILINOGEN UA: 0.2 mg/dL (ref 0.0–1.0)
pH: 6 (ref 5.0–8.0)

## 2013-08-29 ENCOUNTER — Other Ambulatory Visit: Payer: Self-pay | Admitting: Gynecology

## 2013-08-29 ENCOUNTER — Telehealth: Payer: Self-pay

## 2013-08-29 DIAGNOSIS — D649 Anemia, unspecified: Secondary | ICD-10-CM

## 2013-08-29 NOTE — Telephone Encounter (Signed)
As per my previous question..inhaled corticosteroids she taking her iron tablet? I do want an anemia  panel drawn as well. Thanks

## 2013-08-29 NOTE — Telephone Encounter (Signed)
Message copied by Ramond Craver on Tue Aug 29, 2013 12:18 PM ------      Message from: Terrance Mass      Created: Mon Aug 28, 2013  5:07 PM       I would like for her to have an anemia panel done  next week if she is currently taking iron tablet daily because she still is anemic. Please find out how many days her cycles are occurring since she's on the birth control pill. ------

## 2013-08-29 NOTE — Telephone Encounter (Signed)
Per your question below patient said she is not really having a period. Had a 3 day period in Jan but since then only dark spotting and just wears a panty liner during placebo week.

## 2013-08-30 NOTE — Telephone Encounter (Signed)
Patient is taking iron tablet. She has office appt and u/s next week and will have anemia panel drawn then.

## 2013-09-08 ENCOUNTER — Ambulatory Visit (INDEPENDENT_AMBULATORY_CARE_PROVIDER_SITE_OTHER): Payer: Managed Care, Other (non HMO)

## 2013-09-08 ENCOUNTER — Encounter: Payer: Self-pay | Admitting: Gynecology

## 2013-09-08 ENCOUNTER — Ambulatory Visit (INDEPENDENT_AMBULATORY_CARE_PROVIDER_SITE_OTHER): Payer: Managed Care, Other (non HMO) | Admitting: Gynecology

## 2013-09-08 ENCOUNTER — Telehealth: Payer: Self-pay | Admitting: *Deleted

## 2013-09-08 DIAGNOSIS — D638 Anemia in other chronic diseases classified elsewhere: Secondary | ICD-10-CM

## 2013-09-08 DIAGNOSIS — D649 Anemia, unspecified: Secondary | ICD-10-CM

## 2013-09-08 DIAGNOSIS — D259 Leiomyoma of uterus, unspecified: Secondary | ICD-10-CM

## 2013-09-08 LAB — VITAMIN B12: Vitamin B-12: 237 pg/mL (ref 211–911)

## 2013-09-08 LAB — IRON AND TIBC
%SAT: 19 % — AB (ref 20–55)
IRON: 62 ug/dL (ref 42–145)
TIBC: 320 ug/dL (ref 250–470)
UIBC: 258 ug/dL (ref 125–400)

## 2013-09-08 LAB — FOLATE: FOLATE: 7.4 ng/mL

## 2013-09-08 LAB — FERRITIN: Ferritin: 185 ng/mL (ref 10–291)

## 2013-09-08 NOTE — Telephone Encounter (Signed)
Message copied by Thamas Jaegers on Fri Sep 08, 2013 12:28 PM ------      Message from: Colleen Gonzalez      Created: Fri Sep 08, 2013 12:14 PM       Anderson [place], please make an appointment for this patient with Dr. Lucio Edward gastroenterologist for this patient with chronic anemia with no GYN source and currently on iron tablet ------

## 2013-09-08 NOTE — Progress Notes (Signed)
   Patient presented to the office today for an ultrasound as a result of her last office visit whereby a questionable fullness was noted on her left adnexa. Patient with past history of fibroid uterus who several years ago had laparoscopic myomectomy in 1995. Patient is currently on oral contraceptive pills and is having no menstrual cycles.  Ultrasound today: Uterus measures 7.2 x 3.7 x 2.9 cm with endometrial stripe at 2.7 mm. The patient had 4 small fibroids the largest one measuring 13 x 9 mm. Ovaries were otherwise normal no fluid in the cul-de-sac.  Patient also was having an anemia panel drawn because of her chronic anemia despite her being on iron supplementation for many years.  2013: Hemoglobin 11.5 2014 hemoglobin 12.1 2015: Hemoglobin 10.8  Patient denies any blood in her stools and negative family history for colorectal cancer.  Patient will be referred to a gastroenterologist for further assessment.

## 2013-09-11 NOTE — Telephone Encounter (Signed)
Pt called back and said she will make her own appointment, to schedule. This was a cancellation and pt declined due to work.

## 2013-09-11 NOTE — Telephone Encounter (Signed)
Appointment on 09/13/13 with Dr.Stark, left message for pt to call.

## 2013-09-12 ENCOUNTER — Encounter: Payer: Self-pay | Admitting: Gastroenterology

## 2013-09-13 ENCOUNTER — Ambulatory Visit: Payer: Managed Care, Other (non HMO) | Admitting: Gastroenterology

## 2013-09-26 ENCOUNTER — Other Ambulatory Visit: Payer: Self-pay

## 2013-09-26 DIAGNOSIS — Z1231 Encounter for screening mammogram for malignant neoplasm of breast: Secondary | ICD-10-CM

## 2013-10-16 ENCOUNTER — Telehealth: Payer: Self-pay | Admitting: *Deleted

## 2013-11-02 ENCOUNTER — Ambulatory Visit
Admission: RE | Admit: 2013-11-02 | Discharge: 2013-11-02 | Disposition: A | Discharge status: Home / Self Care | Payer: Managed Care, Other (non HMO) | Source: Ambulatory Visit

## 2013-11-02 DIAGNOSIS — Z1231 Encounter for screening mammogram for malignant neoplasm of breast: Secondary | ICD-10-CM

## 2013-11-03 ENCOUNTER — Ambulatory Visit: Payer: Managed Care, Other (non HMO)

## 2013-11-20 ENCOUNTER — Encounter: Payer: Self-pay | Admitting: Gastroenterology

## 2013-11-20 ENCOUNTER — Other Ambulatory Visit (INDEPENDENT_AMBULATORY_CARE_PROVIDER_SITE_OTHER): Payer: Managed Care, Other (non HMO)

## 2013-11-20 ENCOUNTER — Ambulatory Visit (INDEPENDENT_AMBULATORY_CARE_PROVIDER_SITE_OTHER): Payer: Managed Care, Other (non HMO) | Admitting: Gastroenterology

## 2013-11-20 VITALS — BP 128/90 | HR 64 | Ht 63.25 in | Wt 189.1 lb

## 2013-11-20 DIAGNOSIS — D509 Iron deficiency anemia, unspecified: Secondary | ICD-10-CM

## 2013-11-20 LAB — IGA: IgA: 173 mg/dL (ref 68–378)

## 2013-11-20 MED ORDER — PEG-KCL-NACL-NASULF-NA ASC-C 100 G PO SOLR: 1.0000 | Freq: Once | ORAL | Status: DC

## 2013-11-20 NOTE — Progress Notes (Signed)
    History of Present Illness: This is a 48 year old female with a history of recurrent anemia. Iron studies performed in May show hemoglobin = 10.8, Fe saturation = 19% and other studies were normal. She states she's been treated for iron deficiency frequently over the years. She states she has been having light menstrual periods every several months for the past year. She has no GI complaints. Denies weight loss, abdominal pain, constipation, diarrhea, change in stool caliber, melena, hematochezia, nausea, vomiting, dysphagia, reflux symptoms, chest pain.  Review of Systems: Pertinent positive and negative review of systems were noted in the above HPI section. All other review of systems were otherwise negative.  Current Medications, Allergies, Past Medical History, Past Surgical History, Family History and Social History were reviewed in [employer] record.  Physical Exam: General: Well developed , well nourished, no acute distress Head: Normocephalic and atraumatic Eyes:  sclerae anicteric, EOMI Ears: Normal auditory acuity Mouth: No deformity or lesions Neck: Supple, no masses or thyromegaly Lungs: Clear throughout to auscultation Heart: Regular rate and rhythm; no murmurs, rubs or bruits Abdomen: Soft, non tender and non distended. No masses, hepatosplenomegaly or hernias noted. Normal Bowel sounds Rectal: deferred to colonoscopy Musculoskeletal: Symmetrical with no gross deformities  Skin: No lesions on visible extremities Pulses:  Normal pulses noted Extremities: No clubbing, cyanosis, edema or deformities noted Neurological: Alert oriented x 4, grossly nonfocal Cervical Nodes:  No significant cervical adenopathy Inguinal Nodes: No significant inguinal adenopathy Psychological:  Alert and cooperative. Normal mood and affect  Assessment and Recommendations:  1. Fe def anemia. R/O occult GI blood loss, celiac disease. Obtain IgA, tTG. The risks, benefits,  and alternatives to colonoscopy with possible biopsy and possible polypectomy were discussed with the patient and they consent to proceed. The risks, benefits, and alternatives to endoscopy with possible biopsy and possible dilation were discussed with the patient and they consent to proceed.

## 2013-11-20 NOTE — Patient Instructions (Signed)
Your physician has requested that you go to the basement for the following lab work before leaving today: IGA, TTG  You have been scheduled for an endoscopy and colonoscopy. Please follow the written instructions given to you at your visit today. Please pick up your prep at the pharmacy within the next 1-3 days. If you use inhalers (even only as needed), please bring them with you on the day of your procedure. Your physician has requested that you go to www.startemmi.com and enter the access code given to you at your visit today. This web site gives a general overview about your procedure. However, you should still follow specific instructions given to you by our office regarding your preparation for the procedure.  Thank you for choosing me and [facility].  Pricilla Riffle. Dagoberto Ligas., MD., Marval Regal

## 2013-11-22 LAB — TISSUE TRANSGLUTAMINASE, IGA: Tissue Transglutaminase Ab, IgA: 4.6 U/mL (ref <20)

## 2013-12-07 ENCOUNTER — Ambulatory Visit: Payer: Managed Care, Other (non HMO) | Admitting: Gynecology

## 2013-12-14 ENCOUNTER — Encounter: Payer: Self-pay | Admitting: Gastroenterology

## 2014-01-04 ENCOUNTER — Encounter: Payer: Managed Care, Other (non HMO) | Admitting: Gastroenterology

## 2014-01-11 ENCOUNTER — Telehealth: Payer: Self-pay | Admitting: *Deleted

## 2014-01-11 NOTE — Telephone Encounter (Signed)
Pt called asking if she should keep scheduled colonoscopy appointment with Dr.Stark due to office visit on 09/08/13 due amenia. I called and left on voicemail to keep scheduled appointment

## 2014-01-19 ENCOUNTER — Encounter: Payer: Self-pay | Admitting: Gastroenterology

## 2014-01-19 ENCOUNTER — Ambulatory Visit (AMBULATORY_SURGERY_CENTER): Payer: Managed Care, Other (non HMO) | Admitting: Gastroenterology

## 2014-01-19 VITALS — BP 143/101 | HR 62 | Temp 98.0°F | Resp 16 | Ht 63.25 in | Wt 188.0 lb

## 2014-01-19 DIAGNOSIS — D509 Iron deficiency anemia, unspecified: Secondary | ICD-10-CM

## 2014-01-19 MED ORDER — OMEPRAZOLE 20 MG PO CPDR: 20.0000 mg | DELAYED_RELEASE_CAPSULE | Freq: Every day | ORAL | Status: DC

## 2014-01-19 MED ORDER — SODIUM CHLORIDE 0.9 % IV SOLN: 500.0000 mL | INTRAVENOUS | Status: DC

## 2014-01-19 NOTE — Op Note (Signed)
[place]  [address] [place], [id number]   COLONOSCOPY PROCEDURE REPORT  PATIENT: Colleen, Gonzalez  MR#: [id number] BIRTHDATE: 1966-04-07 , 48  yrs. old GENDER: Female ENDOSCOPIST: Ladene Artist, MD, [facility] REFERRED XB:JYNW Toney Rakes, M.D. PROCEDURE DATE:  01/19/2014 PROCEDURE:   Colonoscopy, diagnostic First Screening Colonoscopy - Avg.  risk and is 50 yrs.  old or older - No.  Prior Negative Screening - Now for repeat screening. N/A  History of Adenoma - Now for follow-up colonoscopy & has been > or = to 3 yrs.  N/A  Polyps Removed Today? No.  Recommend repeat exam, <10 yrs? No. ASA CLASS:   Class II INDICATIONS:Iron Deficiency Anemia. MEDICATIONS: MAC sedation, administered by CRNA and propofol (Diprivan) 400mg  IV DESCRIPTION OF PROCEDURE:   After the risks benefits and alternatives of the procedure were thoroughly explained, informed consent was obtained.  A digital rectal exam revealed no abnormalities of the rectum.   The LB [id number]  endoscope was introduced through the anus and advanced to the cecum, which was identified by both the appendix and ileocecal valve. No adverse events experienced.   The quality of the prep was excellent, using MoviPrep  The instrument was then slowly withdrawn as the colon was fully examined.  COLON FINDINGS: A normal appearing cecum, ileocecal valve, and appendiceal orifice were identified.  The ascending, hepatic flexure, transverse, splenic flexure, descending, sigmoid colon and rectum appeared unremarkable.  No polyps or cancers were seen. Retroflexed views revealed small internal hemorrhoids. The time to cecum=3 minutes 35 seconds.  Withdrawal time=10 minutes 13 seconds. The scope was withdrawn and the procedure completed.  COMPLICATIONS: There were no complications.  ENDOSCOPIC IMPRESSION: 1.  Normal colon 2.  Small internal hemorrhoids  RECOMMENDATIONS: 1.  Continue to follow colorectal cancer  screening guidelines for "routine risk" patients with a repeat colonoscopy in 10 years. There is no need for routine, screening FOBT (stool) testing for at least 5 years.  eSigned:  Ladene Artist, MD, [facility] 01/19/2014 1:59 PM

## 2014-01-19 NOTE — Op Note (Signed)
[place]  [address] [place], [id number]   ENDOSCOPY PROCEDURE REPORT  PATIENT: Colleen Gonzalez, Colleen Gonzalez  MR#: [id number] BIRTHDATE: 05/04/1966 , 48  yrs. old GENDER: Female ENDOSCOPIST: Ladene Artist, MD, [facility] REFERRED BY:  Uvaldo Rising, M.D. PROCEDURE DATE:  01/19/2014 PROCEDURE:  EGD, diagnostic ASA CLASS:     Class II INDICATIONS:  Iron deficiency anemia. MEDICATIONS: MAC sedation, administered by CRNA, There was residual sedation effect present from prior procedure, and propofol (Diprivan) 80mg  IV TOPICAL ANESTHETIC: none DESCRIPTION OF PROCEDURE: After the risks benefits and alternatives of the procedure were thoroughly explained, informed consent was obtained.  The LB [id number] endoscope was introduced through the mouth and advanced to the second portion of the duodenum. Without limitations.  The instrument was slowly withdrawn as the mucosa was fully examined.    DUODENUM: Mild duodenal inflammation was found in the duodenal bulb. The duodenal mucosa showed no abnormalities in the 2nd part of the duodenum. STOMACH: The mucosa and folds of the stomach appeared normal. ESOPHAGUS: The mucosa of the esophagus appeared normal.  Retroflexed views revealed no abnormalities.     The scope was then withdrawn from the patient and the procedure completed.  COMPLICATIONS: There were no complications.  ENDOSCOPIC IMPRESSION: 1.   Duodenitis in the duodenal bulb 2.   The EGD was otherwise normal  RECOMMENDATIONS: 1.  PPI qam for 1 month: omeprazole 20 mg daily, #30 2.  Continue Fe replacement and follow up with PCP  eSigned:  Ladene Artist, MD, [facility] 01/19/2014 2:07 PM

## 2014-01-19 NOTE — Patient Instructions (Signed)

## 2014-01-19 NOTE — Progress Notes (Signed)
A/ox3, pleased with MAC, report to RN 

## 2014-01-22 ENCOUNTER — Telehealth: Payer: Self-pay | Admitting: *Deleted

## 2014-01-22 NOTE — Telephone Encounter (Signed)
  Follow up Call-  Call back number 01/19/2014  Post procedure Call Back phone  # [phone]  Permission to leave phone message Yes    [facility]

## 2014-03-19 NOTE — Telephone Encounter (Signed)
ERROR

## 2014-04-11 ENCOUNTER — Encounter: Payer: Self-pay | Admitting: General Practice

## 2014-06-15 ENCOUNTER — Ambulatory Visit: Payer: Managed Care, Other (non HMO) | Admitting: Cardiovascular Disease

## 2014-07-27 ENCOUNTER — Ambulatory Visit: Payer: Managed Care, Other (non HMO) | Admitting: Cardiovascular Disease

## 2014-08-01 ENCOUNTER — Other Ambulatory Visit: Payer: Self-pay | Admitting: Gynecology

## 2014-08-31 ENCOUNTER — Ambulatory Visit: Payer: Managed Care, Other (non HMO) | Admitting: Cardiovascular Disease

## 2014-09-07 ENCOUNTER — Ambulatory Visit (INDEPENDENT_AMBULATORY_CARE_PROVIDER_SITE_OTHER): Payer: Managed Care, Other (non HMO) | Admitting: Gynecology

## 2014-09-07 ENCOUNTER — Encounter: Payer: Self-pay | Admitting: Gynecology

## 2014-09-07 VITALS — BP 122/76 | Ht 63.0 in | Wt 191.0 lb

## 2014-09-07 DIAGNOSIS — D251 Intramural leiomyoma of uterus: Secondary | ICD-10-CM | POA: Diagnosis not present

## 2014-09-07 DIAGNOSIS — D509 Iron deficiency anemia, unspecified: Secondary | ICD-10-CM

## 2014-09-07 DIAGNOSIS — Z01419 Encounter for gynecological examination (general) (routine) without abnormal findings: Secondary | ICD-10-CM | POA: Diagnosis not present

## 2014-09-07 LAB — COMPREHENSIVE METABOLIC PANEL
ALBUMIN: 4.1 g/dL (ref 3.5–5.2)
ALT: 18 U/L (ref 0–35)
AST: 15 U/L (ref 0–37)
Alkaline Phosphatase: 93 U/L (ref 39–117)
BILIRUBIN TOTAL: 0.4 mg/dL (ref 0.2–1.2)
BUN: 16 mg/dL (ref 6–23)
CO2: 22 mEq/L (ref 19–32)
CREATININE: 0.93 mg/dL (ref 0.50–1.10)
Calcium: 9.4 mg/dL (ref 8.4–10.5)
Chloride: 104 mEq/L (ref 96–112)
GLUCOSE: 85 mg/dL (ref 70–99)
POTASSIUM: 4.4 meq/L (ref 3.5–5.3)
Sodium: 139 mEq/L (ref 135–145)
Total Protein: 7.2 g/dL (ref 6.0–8.3)

## 2014-09-07 LAB — CBC WITH DIFFERENTIAL/PLATELET
Basophils Absolute: 0 10*3/uL (ref 0.0–0.1)
Basophils Relative: 0 % (ref 0–1)
Eosinophils Absolute: 0.5 10*3/uL (ref 0.0–0.7)
Eosinophils Relative: 10 % — ABNORMAL HIGH (ref 0–5)
HEMATOCRIT: 36.5 % (ref 36.0–46.0)
Hemoglobin: 11.9 g/dL — ABNORMAL LOW (ref 12.0–15.0)
LYMPHS ABS: 2.3 10*3/uL (ref 0.7–4.0)
LYMPHS PCT: 45 % (ref 12–46)
MCH: 28.1 pg (ref 26.0–34.0)
MCHC: 32.6 g/dL (ref 30.0–36.0)
MCV: 86.1 fL (ref 78.0–100.0)
MPV: 9.3 fL (ref 8.6–12.4)
Monocytes Absolute: 0.3 10*3/uL (ref 0.1–1.0)
Monocytes Relative: 6 % (ref 3–12)
NEUTROS ABS: 2 10*3/uL (ref 1.7–7.7)
Neutrophils Relative %: 39 % — ABNORMAL LOW (ref 43–77)
Platelets: 372 10*3/uL (ref 150–400)
RBC: 4.24 MIL/uL (ref 3.87–5.11)
RDW: 14.4 % (ref 11.5–15.5)
WBC: 5.1 10*3/uL (ref 4.0–10.5)

## 2014-09-07 LAB — LIPID PANEL
Cholesterol: 170 mg/dL (ref 0–200)
HDL: 66 mg/dL (ref >=46)
LDL CALC: 85 mg/dL (ref 0–99)
Total CHOL/HDL Ratio: 2.6 Ratio
Triglycerides: 95 mg/dL (ref <150)
VLDL: 19 mg/dL (ref 0–40)

## 2014-09-07 LAB — TSH: TSH: 2.435 u[IU]/mL (ref 0.350–4.500)

## 2014-09-07 MED ORDER — NORETHIN ACE-ETH ESTRAD-FE 1-20 MG-MCG PO TABS: 1.0000 | ORAL_TABLET | Freq: Every day | ORAL | Status: DC

## 2014-09-07 NOTE — Progress Notes (Signed)
Colleen Gonzalez 1966-04-13 [id number]   History:    49 y.o.  for annual gyn exam with past history of iron deficiency anemia. Last year she underwent EGD and colonoscopy and the only findings worse duodenitis for which she is on protonic and a normal colonoscopy with the exception of small internal hemorrhoid. She is currently on Junel 28/39 49 year old contraceptive pills and is having very light menses every other month. She is also taking her iron supplementation daily. Patient has 2 small uterine fibroids and has a history of laparoscopic myomectomy in 1995. Patient with no past history of abnormal Pap smears. Patient's father with history of non-insulin-dependent diabetes. Patient is fasting today for her blood work.  Past medical history,surgical history, family history and social history were all reviewed and documented in the EPIC chart.  Gynecologic History Patient's last menstrual period was 07/29/2014 (approximate). Contraception: OCP (estrogen/progesterone) Last Pap: 2015. Results were: normal Last mammogram: 2015. Results were: normal  Obstetric History OB History  Gravida Para Term Preterm AB SAB TAB Ectopic Multiple Living  0                  ROS: A ROS was performed and pertinent positives and negatives are included in the history.  GENERAL: No fevers or chills. HEENT: No change in vision, no earache, sore throat or sinus congestion. NECK: No pain or stiffness. CARDIOVASCULAR: No chest pain or pressure. No palpitations. PULMONARY: No shortness of breath, cough or wheeze. GASTROINTESTINAL: No abdominal pain, nausea, vomiting or diarrhea, melena or bright red blood per rectum. GENITOURINARY: No urinary frequency, urgency, hesitancy or dysuria. MUSCULOSKELETAL: No joint or muscle pain, no back pain, no recent trauma. DERMATOLOGIC: No rash, no itching, no lesions. ENDOCRINE: No polyuria, polydipsia, no heat or cold intolerance. No recent change in weight. HEMATOLOGICAL: No  anemia or easy bruising or bleeding. NEUROLOGIC: No headache, seizures, numbness, tingling or weakness. PSYCHIATRIC: No depression, no loss of interest in normal activity or change in sleep pattern.     Exam: chaperone present  BP 122/76 mmHg  Ht 5\' 3"  (1.6 m)  Wt 191 lb (86.637 kg)  BMI 33.84 kg/m2  LMP 07/29/2014 (Approximate)  Body mass index is 33.84 kg/(m^2).  General appearance : Well developed well nourished female. No acute distress HEENT: Eyes: no retinal hemorrhage or exudates,  Neck supple, trachea midline, no carotid bruits, no thyroidmegaly Lungs: Clear to auscultation, no rhonchi or wheezes, or rib retractions  Heart: Regular rate and rhythm, no murmurs or gallops Breast:Examined in sitting and supine position were symmetrical in appearance, no palpable masses or tenderness,  no skin retraction, no nipple inversion, no nipple discharge, no skin discoloration, no axillary or supraclavicular lymphadenopathy Abdomen: no palpable masses or tenderness, no rebound or guarding Extremities: no edema or skin discoloration or tenderness  Pelvic:  Bartholin, Urethra, Skene Glands: Within normal limits             Vagina: No gross lesions or discharge  Cervix: No gross lesions or discharge  Uterus  anteverted, normal size, shape and consistency, non-tender and mobile  Adnexa  Without masses or tenderness  Anus and perineum  normal   Rectovaginal  normal sphincter tone without palpated masses or tenderness             Hemoccult not indicated     Assessment/Plan:  49 y.o. female for annual exam who is doing well. The following fasting screening labs were ordered: Comprehensive metabolic panel, fasting lipid profile, TSH, CBC,  and urinalysis. Pap smear was not done today in accordance to the new guidelines. Patient to continue her iron supplementation.   Terrance Mass MD, 12:40 PM 09/07/2014

## 2014-09-08 LAB — URINALYSIS W MICROSCOPIC + REFLEX CULTURE
Bacteria, UA: NONE SEEN
Bilirubin Urine: NEGATIVE
CRYSTALS: NONE SEEN
Casts: NONE SEEN
GLUCOSE, UA: NEGATIVE mg/dL
Hgb urine dipstick: NEGATIVE
Ketones, ur: NEGATIVE mg/dL
LEUKOCYTES UA: NEGATIVE
NITRITE: NEGATIVE
PH: 6 (ref 5.0–8.0)
Protein, ur: NEGATIVE mg/dL
SPECIFIC GRAVITY, URINE: 1.016 (ref 1.005–1.030)
Squamous Epithelial / LPF: NONE SEEN
UROBILINOGEN UA: 0.2 mg/dL (ref 0.0–1.0)

## 2014-09-10 ENCOUNTER — Telehealth: Payer: Self-pay

## 2014-09-10 NOTE — Telephone Encounter (Signed)
Per staff message from Dr. Moshe Salisbury "       Please inform patient her anemia is almost corrected. Her hemoglobin was 11.9 normal as 12-15 g one year ago she was 10.8. Please have her continue to take her iron tablet one daily""

## 2014-09-13 NOTE — Telephone Encounter (Signed)
Left detailed message with result/recommendation on home answering machine per DPR access note.

## 2014-10-02 ENCOUNTER — Other Ambulatory Visit: Payer: Self-pay

## 2014-10-02 DIAGNOSIS — Z1231 Encounter for screening mammogram for malignant neoplasm of breast: Secondary | ICD-10-CM

## 2014-11-09 ENCOUNTER — Ambulatory Visit: Payer: Managed Care, Other (non HMO)

## 2014-11-14 ENCOUNTER — Ambulatory Visit
Admission: RE | Admit: 2014-11-14 | Discharge: 2014-11-14 | Disposition: A | Discharge status: Home / Self Care | Payer: Managed Care, Other (non HMO) | Source: Ambulatory Visit

## 2014-11-14 DIAGNOSIS — Z1231 Encounter for screening mammogram for malignant neoplasm of breast: Secondary | ICD-10-CM

## 2014-11-20 ENCOUNTER — Telehealth: Payer: Self-pay | Admitting: Family Medicine

## 2014-11-20 NOTE — Telephone Encounter (Signed)
Appointment given for 7//22 with [facility] to get referral.

## 2014-11-30 ENCOUNTER — Ambulatory Visit: Payer: Managed Care, Other (non HMO) | Admitting: Family Medicine

## 2015-05-07 ENCOUNTER — Ambulatory Visit (INDEPENDENT_AMBULATORY_CARE_PROVIDER_SITE_OTHER): Payer: Managed Care, Other (non HMO) | Admitting: Family Medicine

## 2015-05-07 ENCOUNTER — Encounter: Payer: Self-pay | Admitting: Family Medicine

## 2015-05-07 ENCOUNTER — Telehealth: Payer: Self-pay | Admitting: *Deleted

## 2015-05-07 VITALS — BP 148/93 | HR 64 | Temp 99.2°F | Ht 63.0 in | Wt 183.6 lb

## 2015-05-07 DIAGNOSIS — L03119 Cellulitis of unspecified part of limb: Secondary | ICD-10-CM | POA: Diagnosis not present

## 2015-05-07 DIAGNOSIS — L02419 Cutaneous abscess of limb, unspecified: Secondary | ICD-10-CM

## 2015-05-07 MED ORDER — DOXYCYCLINE HYCLATE 100 MG PO TABS: 100.0000 mg | ORAL_TABLET | Freq: Two times a day (BID) | ORAL | Status: DC

## 2015-05-07 NOTE — Progress Notes (Signed)
   Subjective:    Patient ID: Colleen Gonzalez, female    DOB: 21-Jul-1965, 49 y.o.   MRN: [id number]  HPI 49 year old female with a ball on her right upper thigh anterior. This has been a recurrent problem recently. There is been little bit of drainage. Last ball was treated with boil ease and resolved without prescription medicine.  Patient Active Problem List   Diagnosis Date Noted  . Family history of premature CAD 04/28/2013  . Tricuspid murmur 04/28/2013  . Weight gain 08/19/2012  . Family history of diabetes mellitus 08/19/2012  . Anemia 08/28/2011  . Fibroid, uterus 08/28/2011   Outpatient Encounter Prescriptions as of 05/07/2015  Medication Sig  . ferrous sulfate 325 (65 FE) MG tablet Take 325 mg by mouth daily with breakfast.  . norethindrone-ethinyl estradiol (MICROGESTIN FE 1/20) 1-20 MG-MCG tablet Take 1 tablet by mouth daily.   No facility-administered encounter medications on file as of 05/07/2015.      Review of Systems  Constitutional: Negative.   Respiratory: Negative.   Cardiovascular: Negative.   Skin: Positive for wound.       Objective:   Physical Exam  Constitutional: She appears well-developed and well-nourished.  Cardiovascular: Normal rate.   Pulmonary/Chest: Effort normal and breath sounds normal.  Skin:  Boil on right anterior thigh is described in history of present illness it is indurated and tender but not fluctuant. Explained to patient the difference between induration and fluctuance. Explained that is not ready to open yet.          Assessment & Plan:  1. Cellulitis and abscess of leg Apply warm compresses at least 4 times a day. Rx for doxycycline 100 mg twice a day for 1 week recheck left on as needed basis depending on how it responds to treatment  [name] MD

## 2015-05-07 NOTE — Telephone Encounter (Signed)
Pt called c/o repeat boils I advised pt to schedule OV with provider, will have front desk contact pt. Pt aware nancy young is off.

## 2015-06-19 DIAGNOSIS — Z0289 Encounter for other administrative examinations: Secondary | ICD-10-CM

## 2015-09-13 ENCOUNTER — Ambulatory Visit (INDEPENDENT_AMBULATORY_CARE_PROVIDER_SITE_OTHER): Payer: 59 | Admitting: Gynecology

## 2015-09-13 ENCOUNTER — Encounter: Payer: Self-pay | Admitting: Gynecology

## 2015-09-13 VITALS — BP 125/82 | Ht 63.0 in | Wt 182.4 lb

## 2015-09-13 DIAGNOSIS — R102 Pelvic and perineal pain: Secondary | ICD-10-CM | POA: Diagnosis not present

## 2015-09-13 DIAGNOSIS — Z01419 Encounter for gynecological examination (general) (routine) without abnormal findings: Secondary | ICD-10-CM

## 2015-09-13 DIAGNOSIS — D251 Intramural leiomyoma of uterus: Secondary | ICD-10-CM

## 2015-09-13 LAB — CBC WITH DIFFERENTIAL/PLATELET
BASOS ABS: 66 {cells}/uL (ref 0–200)
Basophils Relative: 1 %
EOS PCT: 17 %
Eosinophils Absolute: 1122 cells/uL — ABNORMAL HIGH (ref 15–500)
HCT: 36.7 % (ref 35.0–45.0)
HEMOGLOBIN: 11.7 g/dL (ref 11.7–15.5)
LYMPHS ABS: 2706 {cells}/uL (ref 850–3900)
Lymphocytes Relative: 41 %
MCH: 27.8 pg (ref 27.0–33.0)
MCHC: 31.9 g/dL — ABNORMAL LOW (ref 32.0–36.0)
MCV: 87.2 fL (ref 80.0–100.0)
MPV: 9.6 fL (ref 7.5–12.5)
Monocytes Absolute: 264 cells/uL (ref 200–950)
Monocytes Relative: 4 %
NEUTROS ABS: 2442 {cells}/uL (ref 1500–7800)
Neutrophils Relative %: 37 %
PLATELETS: 408 10*3/uL — AB (ref 140–400)
RBC: 4.21 MIL/uL (ref 3.80–5.10)
RDW: 13.8 % (ref 11.0–15.0)
WBC: 6.6 10*3/uL (ref 3.8–10.8)

## 2015-09-13 LAB — COMPREHENSIVE METABOLIC PANEL
ALBUMIN: 4.2 g/dL (ref 3.6–5.1)
ALT: 9 U/L (ref 6–29)
AST: 11 U/L (ref 10–35)
Alkaline Phosphatase: 72 U/L (ref 33–115)
BILIRUBIN TOTAL: 0.4 mg/dL (ref 0.2–1.2)
BUN: 18 mg/dL (ref 7–25)
CO2: 22 mmol/L (ref 20–31)
CREATININE: 1.08 mg/dL (ref 0.50–1.10)
Calcium: 8.9 mg/dL (ref 8.6–10.2)
Chloride: 105 mmol/L (ref 98–110)
Glucose, Bld: 80 mg/dL (ref 65–99)
Potassium: 4.3 mmol/L (ref 3.5–5.3)
SODIUM: 136 mmol/L (ref 135–146)
TOTAL PROTEIN: 6.9 g/dL (ref 6.1–8.1)

## 2015-09-13 LAB — LIPID PANEL
CHOLESTEROL: 161 mg/dL (ref 125–200)
HDL: 62 mg/dL (ref >=46)
LDL CALC: 85 mg/dL (ref <130)
TRIGLYCERIDES: 72 mg/dL (ref <150)
Total CHOL/HDL Ratio: 2.6 Ratio (ref <=5.0)
VLDL: 14 mg/dL (ref <30)

## 2015-09-13 NOTE — Progress Notes (Signed)
Colleen Gonzalez Apr 01, 1966 [id number]   History:    50 y.o.  for annual gyn exam who states that for the past several months she's been having low pelvic pains on and off on one side of her lower abdomen all to any with the opposite side. She is on Junel 1/20 oral contraceptive pills and states that sometimes she'll have a very light period of the time she will.Patient has 2 small uterine fibroids and has a history of laparoscopic myomectomy in 1995. Patient with no past history of abnormal Pap smears. Patient's father with history of non-insulin-dependent diabetes. Patient is fasting today for her blood work.  Past medical history,surgical history, family history and social history were all reviewed and documented in the EPIC chart.  Gynecologic History Patient's last menstrual period was 06/17/2015 (approximate). Contraception: OCP (estrogen/progesterone) Last Pap: 2015. Results were: normal Last mammogram: 2016. Results were: normal  Obstetric History OB History  Gravida Para Term Preterm AB SAB TAB Ectopic Multiple Living  0                  ROS: A ROS was performed and pertinent positives and negatives are included in the history.  GENERAL: No fevers or chills. HEENT: No change in vision, no earache, sore throat or sinus congestion. NECK: No pain or stiffness. CARDIOVASCULAR: No chest pain or pressure. No palpitations. PULMONARY: No shortness of breath, cough or wheeze. GASTROINTESTINAL: No abdominal pain, nausea, vomiting or diarrhea, melena or bright red blood per rectum. GENITOURINARY: No urinary frequency, urgency, hesitancy or dysuria. MUSCULOSKELETAL: No joint or muscle pain, no back pain, no recent trauma. DERMATOLOGIC: No rash, no itching, no lesions. ENDOCRINE: No polyuria, polydipsia, no heat or cold intolerance. No recent change in weight. HEMATOLOGICAL: No anemia or easy bruising or bleeding. NEUROLOGIC: No headache, seizures, numbness, tingling or weakness. PSYCHIATRIC:  No depression, no loss of interest in normal activity or change in sleep pattern.     Exam: chaperone present  BP 125/82 mmHg  Ht 5\' 3"  (1.6 m)  Wt 182 lb 6.4 oz (82.736 kg)  BMI 32.32 kg/m2  LMP 06/17/2015 (Approximate)  Body mass index is 32.32 kg/(m^2).  General appearance : Well developed well nourished female. No acute distress HEENT: Eyes: no retinal hemorrhage or exudates,  Neck supple, trachea midline, no carotid bruits, no thyroidmegaly Lungs: Clear to auscultation, no rhonchi or wheezes, or rib retractions  Heart: Regular rate and rhythm, no murmurs or gallops Breast:Examined in sitting and supine position were symmetrical in appearance, no palpable masses or tenderness,  no skin retraction, no nipple inversion, no nipple discharge, no skin discoloration, no axillary or supraclavicular lymphadenopathy Abdomen: no palpable masses or tenderness, no rebound or guarding Extremities: no edema or skin discoloration or tenderness  Pelvic:  Bartholin, Urethra, Skene Glands: Within normal limits             Vagina: No gross lesions or discharge  Cervix: No gross lesions or discharge  Uterus  anteverted, normal size, shape and consistency, non-tender and mobile  Adnexa  Without masses or tenderness  Anus and perineum  normal   Rectovaginal  normal sphincter tone without palpated masses or tenderness             Hemoccult not indicated     Assessment/Plan:  51 y.o. female for annual exam will return back to the office in 1-2 weeks for pelvic ultrasound to better assess her ovaries due to her low abdominal discomfort as well as to  follow-up on her previously seen fibroids.. Pap smear not indicated this year. Prescription refill for oral contraceptive pill was provided. The following screening blood work was ordered today: Fasting lipid profile, comprehensive metabolic panel, CBC, TSH, and urinalysis. Patient was reminded to schedule her mammogram July of this year.   Terrance Mass MD, 2:41 PM 09/13/2015

## 2015-09-14 LAB — URINALYSIS W MICROSCOPIC + REFLEX CULTURE
Bacteria, UA: NONE SEEN [HPF]
Bilirubin Urine: NEGATIVE
CASTS: NONE SEEN [LPF]
CRYSTALS: NONE SEEN [HPF]
Glucose, UA: NEGATIVE
HGB URINE DIPSTICK: NEGATIVE
KETONES UR: NEGATIVE
Leukocytes, UA: NEGATIVE
Nitrite: NEGATIVE
Protein, ur: NEGATIVE
RBC / HPF: NONE SEEN RBC/HPF (ref <=2)
SPECIFIC GRAVITY, URINE: 1.011 (ref 1.001–1.035)
Squamous Epithelial / LPF: NONE SEEN [HPF] (ref <=5)
WBC, UA: NONE SEEN WBC/HPF (ref <=5)
Yeast: NONE SEEN [HPF]
pH: 6 (ref 5.0–8.0)

## 2015-09-14 LAB — TSH: TSH: 1.92 mIU/L

## 2015-09-16 ENCOUNTER — Ambulatory Visit (INDEPENDENT_AMBULATORY_CARE_PROVIDER_SITE_OTHER): Payer: 59

## 2015-09-16 ENCOUNTER — Other Ambulatory Visit: Payer: Self-pay | Admitting: Gynecology

## 2015-09-16 ENCOUNTER — Ambulatory Visit (INDEPENDENT_AMBULATORY_CARE_PROVIDER_SITE_OTHER): Payer: 59 | Admitting: Gynecology

## 2015-09-16 VITALS — BP 138/86

## 2015-09-16 DIAGNOSIS — R7989 Other specified abnormal findings of blood chemistry: Secondary | ICD-10-CM

## 2015-09-16 DIAGNOSIS — R102 Pelvic and perineal pain: Secondary | ICD-10-CM

## 2015-09-16 DIAGNOSIS — D251 Intramural leiomyoma of uterus: Secondary | ICD-10-CM | POA: Diagnosis not present

## 2015-09-16 DIAGNOSIS — D473 Essential (hemorrhagic) thrombocythemia: Secondary | ICD-10-CM

## 2015-09-16 DIAGNOSIS — D721 Eosinophilia, unspecified: Secondary | ICD-10-CM

## 2015-09-16 LAB — CBC WITH DIFFERENTIAL/PLATELET
BASOS ABS: 0 {cells}/uL (ref 0–200)
BASOS PCT: 0 %
EOS ABS: 1480 {cells}/uL — AB (ref 15–500)
Eosinophils Relative: 20 %
HEMATOCRIT: 36.7 % (ref 35.0–45.0)
Hemoglobin: 11.9 g/dL (ref 11.7–15.5)
LYMPHS PCT: 46 %
Lymphs Abs: 3404 cells/uL (ref 850–3900)
MCH: 28.5 pg (ref 27.0–33.0)
MCHC: 32.4 g/dL (ref 32.0–36.0)
MCV: 87.8 fL (ref 80.0–100.0)
MONO ABS: 518 {cells}/uL (ref 200–950)
MPV: 9.5 fL (ref 7.5–12.5)
Monocytes Relative: 7 %
Neutro Abs: 1998 cells/uL (ref 1500–7800)
Neutrophils Relative %: 27 %
Platelets: 394 10*3/uL (ref 140–400)
RBC: 4.18 MIL/uL (ref 3.80–5.10)
RDW: 14.1 % (ref 11.0–15.0)
WBC: 7.4 10*3/uL (ref 3.8–10.8)

## 2015-09-16 NOTE — Progress Notes (Signed)
   Patient is a 50 year old was seen in the office for annual exam early this month. She had been having on and off lower abdominal discomfort. Patient had a laparoscopic myomectomy 1995. She is currently on Junel 1/20 oral contraceptive pills and states that sometimes she'll have a very light period. . Does have history small fibroids in the past and she was here for an ultrasound for follow-up. Review of her blood work done at time of her annual exam demonstrate our labs were normal with the exception of her CBC indicating that her Eosinophil count was elevated as was her platelet count slightly elevated so CBC will be repeated today. Patient otherwise a systematic today.  Ultrasound uterus measures 7.7 x 4.7 x 3.87 m with endometrial stripe 1.5 mm. Patient with 5 small intramural fibroids as follows: 16 x 14 mm, 17 x 14 mm, 18 x 15 mm, 25 x 60 mm, and 18 x 15 mm all intramural. Right and left ovary were otherwise normal no fluid in the cul-de-sac. No adnexal masses.  Assessment/plan: Stable small fibroids. 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 CBC today for follow-up on slightly elevated platelet count and eosinophil  Greater than 50% of the time was spent counseling and coordinating care for this patient with small intramural fibroids and abnormal CBC. Time of consultation minutes

## 2015-09-18 ENCOUNTER — Telehealth: Payer: Self-pay | Admitting: *Deleted

## 2015-09-18 NOTE — Telephone Encounter (Signed)
DR. Elita Quick FERNANDEZ WANTS TO SPEAK WITH DR. BARDELAS ABOUT Careli. PLEASE RETURN CALL BEFORE 12 OR AFTER 1. PHONE NUMBER IS [phone].

## 2015-09-19 ENCOUNTER — Telehealth: Payer: Self-pay | Admitting: *Deleted

## 2015-09-19 NOTE — Telephone Encounter (Signed)
Appt. On 10/08/15 @ 1:30pm with Dr.Kozlow I left this info on her voicemail, told her to call me to confirm she received my message, his office can review notes in epic.

## 2015-09-19 NOTE — Telephone Encounter (Signed)
-----   Message from Ramond Craver, [place] sent at 09/18/2015 10:59 AM EDT ----- Regarding: referral Please inform patient I would like to refer to Dr. Allena Katz who is an immunologist/allergist because of her elevated Eosinophil which is a component of her blood count which is indicative of some chronic allergy or infection that I would like for her to follow-up. Please make appointment for her.   Anderson Malta, I have not reached patient yet.   Thanks

## 2015-09-19 NOTE — Telephone Encounter (Signed)
Pt called back to tell me she received my message, also told pt they would like her to stop all antihistamines 3 days prior to her appt.  Pt verbalized she understood.

## 2015-09-23 ENCOUNTER — Other Ambulatory Visit: Payer: Self-pay | Admitting: Gynecology

## 2015-09-25 ENCOUNTER — Other Ambulatory Visit: Payer: 59

## 2015-09-25 ENCOUNTER — Ambulatory Visit: Payer: 59 | Admitting: Gynecology

## 2015-10-08 ENCOUNTER — Ambulatory Visit: Payer: 59 | Admitting: Allergy and Immunology

## 2015-10-10 ENCOUNTER — Other Ambulatory Visit: Payer: Self-pay | Admitting: Gynecology

## 2015-10-10 DIAGNOSIS — Z1231 Encounter for screening mammogram for malignant neoplasm of breast: Secondary | ICD-10-CM

## 2015-10-11 ENCOUNTER — Encounter: Payer: Self-pay | Admitting: Allergy and Immunology

## 2015-10-11 ENCOUNTER — Ambulatory Visit (INDEPENDENT_AMBULATORY_CARE_PROVIDER_SITE_OTHER): Payer: 59 | Admitting: Allergy and Immunology

## 2015-10-11 VITALS — BP 130/80 | HR 78 | Temp 98.3°F | Resp 18 | Ht 62.8 in | Wt 188.2 lb

## 2015-10-11 DIAGNOSIS — H1010 Acute atopic conjunctivitis, unspecified eye: Secondary | ICD-10-CM | POA: Diagnosis not present

## 2015-10-11 DIAGNOSIS — J309 Allergic rhinitis, unspecified: Secondary | ICD-10-CM

## 2015-10-11 DIAGNOSIS — R21 Rash and other nonspecific skin eruption: Secondary | ICD-10-CM | POA: Diagnosis not present

## 2015-10-11 MED ORDER — LORATADINE 10 MG PO TABS: 10.0000 mg | ORAL_TABLET | Freq: Every day | ORAL | Status: DC

## 2015-10-11 NOTE — Patient Instructions (Signed)
Take Home Sheet  1. Avoidance: Mite and Pollen   2. Antihistamine: Claritin 10mg  by mouth once daily for runny nose or itching as needed.   3. Nasal Spray: Saline 2  spray(s) each nostril twice daily for stuffy nose or drainage.    4.  Dr. Ishmael Holter will call you after review information from Dermatology biopsy etc and plan for additional labs as discussed.  8. Follow up Visit: 2-3 months or sooner if needed.   Websites that have reliable Patient information: 1. American Academy of Asthma, Allergy, & Immunology: www.aaaai.org 2. Food Allergy Network: www.foodallergy.org 3. Mothers of Asthmatics: www.aanma.org 4. [place]: DiningCalendar.de 5. American College of Allergy, Asthma, & Immunology: https://robertson.info/ or www.acaai.org  Control of House Dust Mite Allergen  House dust mites play a major role in allergic asthma and rhinitis.  They occur in environments with high humidity wherever human skin, the food for dust mites is found. High levels have been detected in dust obtained from mattresses, pillows, carpets, upholstered furniture, bed covers, clothes and soft toys.  The principal allergen of the house dust mite is found in its feces.  A gram of dust may contain 1,000 mites and 250,000 fecal particles.  Mite antigen is easily measured in the air during house cleaning activities.  1. Encase mattresses, including the box spring, and pillow, in an air tight cover.  Seal the zipper end of the encased mattresses with wide adhesive tape. 2. Wash the bedding in water of 130 degrees Farenheit weekly.  Avoid cotton comforters/quilts and flannel bedding: the most ideal bed covering is the dacron comforter. 3. Remove all upholstered furniture from the bedroom. 4. Remove carpets, carpet padding, rugs, and non-washable window drapes from the bedroom.  Wash drapes weekly or use plastic window coverings. 5. Remove all non-washable stuffed toys from the bedroom.  Wash  stuffed toys weekly. 6. Have the room cleaned frequently with a vacuum cleaner and a damp dust-mop.  The patient should not be in a room which is being cleaned and should wait 1 hour after cleaning before going into the room. 7. Close and seal all heating outlets in the bedroom.  Otherwise, the room will become filled with dust-laden air.  An electric heater can be used to heat the room. 8. Reduce indoor humidity to less than 50%.  Do not use a humidifier.  Reducing Pollen Exposure  The American Academy of Allergy, Asthma and Immunology suggests the following steps to reduce your exposure to pollen during allergy seasons.  9. Do not hang sheets or clothing out to dry; pollen may collect on these items. 10. Do not mow lawns or spend time around freshly cut grass; mowing stirs up pollen. 11. Keep windows closed at night.  Keep car windows closed while driving. 12. Minimize morning activities outdoors, a time when pollen counts are usually at their highest. 13. Stay indoors as much as possible when pollen counts or humidity is high and on windy days when pollen tends to remain in the air longer. 14. Use air conditioning when possible.  Many air conditioners have filters that trap the pollen spores. 15. Use a HEPA room air filter to remove pollen form the indoor air you breathe.  Control of Mold Allergen  Mold and fungi can grow on a variety of surfaces provided certain temperature and moisture conditions exist.  Outdoor molds grow on plants, decaying vegetation and soil.  The major outdoor mold, Alternaria dn Cladosporium, are found in very high  numbers during hot and dry conditions.  Generally, a late Summer - Fall peak is seen for common outdoor fungal spores.  Rain will temporarily lower outdoor mold spore count, but counts rise rapidly when the rainy period ends.  The most important indoor molds are Aspergillus and Penicillium.  Dark, humid and poorly ventilated basements are ideal sites for mold  growth.  The next most common sites of mold growth are the bathroom and the kitchen.  Outdoor [employer] 1. Use air conditioning and keep windows closed 2. Avoid exposure to decaying vegetation. 3. Avoid leaf raking. 4. Avoid grain handling. 5. Consider wearing a face mask if working in moldy areas.  Indoor Mold Control 1. Maintain humidity below 50%. 2. Clean washable surfaces with 5% bleach solution. 3. Remove sources e.g. Contaminated carpets.  Control of Cockroach Allergen  Cockroach allergen has been identified as an important cause of acute attacks of asthma, especially in urban settings.  There are fifty-five species of cockroach that exist in the [place], however only three, the [place], [place] species produce allergen that can affect patients with Asthma.  Allergens can be obtained from fecal particles, egg casings and secretions from cockroaches.  1. Remove food sources. 2. Reduce access to water. 3. Seal access and entry points. 4. Spray runways with 0.5-1% Diazinon or Chlorpyrifos 5. Blow boric acid power under stoves and refrigerator. 6. Place bait stations (hydramethylnon) at feeding sites.

## 2015-10-11 NOTE — Progress Notes (Signed)
NEW PATIENT NOTE  RE: Colleen Gonzalez MRN: [id number] DOB: 08-Nov-1965 [facility] [place] [address] [place] [id number] Date of Office Visit: 10/11/2015  Dear Colleen Honour, MD:  I had the pleasure of seeing Colleen Gonzalez  today in initial evaluation, as you recall-- Subjective:  Colleen Gonzalez is a 50 y.o. female who presents today for New Patient (Initial Visit)  Assessment:   1. Allergic rhinoconjunctivitis   2.      Patient report of eczema--adult onset history. 3.      Elevated eosinophils. Plan:   Meds ordered this encounter  Medications  . loratadine (CLARITIN) 10 MG tablet    Sig: Take 1 tablet (10 mg total) by mouth daily.    Dispense:  30 tablet    Refill:  5  1. Avoidance: Mite and Pollen 2. Antihistamine: Claritin 10mg  by mouth once daily for runny nose or itching as needed. 3. Nasal Spray: Saline 2  spray(s) each nostril twice daily for stuffy nose or drainage.  4.  Dr. Ishmael Holter will call you after review information from Dermatology, biopsy etc and plan for additional labs as discussed. 5. Follow up Visit: 2-3 months or sooner if needed.  HPI: Colleen Gonzalez presents to the office in initial evaluation reporting a history of mild allergies with recent physician concern for elevated eosinophils as she reports being very unsure why she is here today.  Colleen Gonzalez describes intermittent rhinorrhea, congestion, sneezing, itchy watery eyes and nose, nasal drip which are not bothersome for her.  Typically not requiring any recurring medication only intermittent Benadryl sinus.  She denies headache, sore throat, wheezing, difficulty breathing, shortness of breath and only recalls bronchitis once in 2015.  There has been no recent systemic steroids, exercise, nocturnal or daily difficulty.  She has followed with dermatology, Dr. Wilhemina Bonito in the last year related to eczema using moisturizers daily and occasional steroid cream.  She reports a biopsy of her skin  difficulties.  There have been no acute reactions, swelling, hives or concern for food sensitivities.  She believes there is mild sensitivity to pollen with outdoors or fluctuant weather patterns as contributing to her symptoms.  Possible positive skin test, 10-20 years ago.  Denies ED or Urgent care visits, prednisone or antibiotic courses.  Medical History: Past Medical History  Diagnosis Date  . Acne   . Anemia   . Eczema    Surgical History: Past Surgical History  Procedure Laterality Date  . Pelvic laparoscopy  10/30/1993    MYOMECTOMY, LYSIS OF ADHESIONS..  . Transthoracic echocardiogram  01/03/2010    EF=>55%; mild-mod TR; trace AV regurg    Family History: Family History  Problem Relation Age of Onset  . Heart disease Mother     ANGINA; MI @ 61  . Diabetes Father   . Breast cancer Paternal Aunt 93  . Hypertension Maternal Grandmother   . Uterine cancer Maternal Grandmother   . Hypertension Father    Social History: Social History  . Marital Status: Single    Spouse Name: N/A  . Number of Children: 0  . Years of Education: 12   Occupational History  . operator(module) [employer]   Social History Main Topics  . Smoking status: Never Smoker   . Smokeless tobacco: Never Used  . Alcohol Use: 0.0 oz/week    0 Standard drinks or equivalent per week     Comment: OCC  . Drug Use: No  . Sexual Activity: Yes  Birth Control/ Protection: Pill   Social History Narrative    Colleen Gonzalez has a current medication list which includes the following prescription(s): ferrous sulfate, ibuprofen, microgestin fe 1/20.   Drug Allergies: No Known Allergies  Environmental History: Colleen Gonzalez lives in a 48 year old house for 2 years with carpeted floors, with central heat and air; stuffed mattress, non-feather pillow/comforter without humidifier, pets and smokers.   Review of Systems  Constitutional: Negative for fever, weight loss and malaise/fatigue.  HENT: Positive for  congestion. Negative for ear pain, hearing loss, nosebleeds and sore throat.   Eyes: Negative for discharge and redness.  Respiratory: Negative for shortness of breath.        Denies history of bronchitis and pneumonia.  Gastrointestinal: Negative for heartburn, nausea, vomiting, abdominal pain, diarrhea and constipation.  Genitourinary: Negative.   Musculoskeletal: Negative for myalgias and joint pain.  Skin: Negative.  Negative for itching and rash.  Neurological: Negative.  Negative for dizziness, seizures, weakness and headaches.  Endo/Heme/Allergies: Positive for environmental allergies.       Denies sensitivity to aspirin, NSAIDs, stinging insects, foods, latex, jewelry and cosmetics.  Immunological: No chronic or recurring infections. Objective:   Filed Vitals:   10/11/15 1346  BP: 130/80  Pulse: 78  Temp: 98.3 F (36.8 C)  Resp: 18   Physical Exam  Constitutional: She is well-developed, well-nourished, and in no distress.  HENT:  Head: Atraumatic.  Right Ear: Tympanic membrane and ear canal normal.  Left Ear: Tympanic membrane and ear canal normal.  Nose: Mucosal edema present. No rhinorrhea. No epistaxis.  Mouth/Throat: Oropharynx is clear and moist and mucous membranes are normal. No oropharyngeal exudate, posterior oropharyngeal edema or posterior oropharyngeal erythema.  Eyes: Conjunctivae are normal.  Neck: Neck supple.  Cardiovascular: Normal rate, S1 normal and S2 normal.   No murmur heard. Pulmonary/Chest: Effort normal. She has no wheezes. She has no rhonchi. She has no rales.  Abdominal: Soft. Normal appearance and bowel sounds are normal.  Musculoskeletal: She exhibits no edema.  Lymphadenopathy:    She has no cervical adenopathy.  Neurological: She is alert.  Skin: Skin is warm and intact. No rash noted. No cyanosis. Nails show no clubbing.  Clear, mild xerosis.   Diagnostics:  Skin testing:  Strong reactivity to Dust mite and cockroach, mild reactivity  to tree pollen mix, otherwise negative  including selective foods.     Colleen Gonzalez M. Ishmael Holter, MD   cc: Hilbert Corrigan, MD

## 2015-11-22 ENCOUNTER — Ambulatory Visit
Admission: RE | Admit: 2015-11-22 | Discharge: 2015-11-22 | Disposition: A | Discharge status: Home / Self Care | Payer: 59 | Source: Ambulatory Visit | Attending: Gynecology | Admitting: Gynecology

## 2015-11-22 DIAGNOSIS — Z1231 Encounter for screening mammogram for malignant neoplasm of breast: Secondary | ICD-10-CM

## 2015-11-26 ENCOUNTER — Other Ambulatory Visit: Payer: Self-pay | Admitting: Gynecology

## 2015-11-26 DIAGNOSIS — R928 Other abnormal and inconclusive findings on diagnostic imaging of breast: Secondary | ICD-10-CM

## 2015-12-02 ENCOUNTER — Ambulatory Visit
Admission: RE | Admit: 2015-12-02 | Discharge: 2015-12-02 | Disposition: A | Discharge status: Home / Self Care | Payer: 59 | Source: Ambulatory Visit | Attending: Gynecology | Admitting: Gynecology

## 2015-12-02 DIAGNOSIS — R928 Other abnormal and inconclusive findings on diagnostic imaging of breast: Secondary | ICD-10-CM

## 2016-03-02 ENCOUNTER — Encounter: Payer: Self-pay | Admitting: Cardiology

## 2016-03-13 ENCOUNTER — Ambulatory Visit: Payer: 59 | Admitting: Cardiology

## 2016-04-17 ENCOUNTER — Ambulatory Visit: Payer: 59 | Admitting: Cardiology

## 2016-04-17 ENCOUNTER — Encounter: Payer: Self-pay | Admitting: Family

## 2016-04-17 ENCOUNTER — Ambulatory Visit: Payer: 59 | Admitting: Family

## 2016-04-17 ENCOUNTER — Ambulatory Visit (INDEPENDENT_AMBULATORY_CARE_PROVIDER_SITE_OTHER): Payer: 59 | Admitting: Family

## 2016-04-17 VITALS — BP 156/105 | HR 67 | Temp 98.9°F | Resp 16 | Ht 63.0 in | Wt 195.6 lb

## 2016-04-17 DIAGNOSIS — L0291 Cutaneous abscess, unspecified: Secondary | ICD-10-CM | POA: Diagnosis not present

## 2016-04-17 MED ORDER — SULFAMETHOXAZOLE-TRIMETHOPRIM 800-160 MG PO TABS: 1.0000 | ORAL_TABLET | Freq: Two times a day (BID) | ORAL | 0 refills | Status: DC

## 2016-04-17 NOTE — Patient Instructions (Signed)
Skin Abscess A skin abscess is an infected area on or under your skin that contains a collection of pus and other material. An abscess may also be called a furuncle, carbuncle, or boil. An abscess can occur in or on almost any part of your body. Some abscesses break open (rupture) on their own. Most continue to get worse unless they are treated. The infection can spread deeper into the body and eventually into your blood, which can make you feel ill. Treatment usually involves draining the abscess. What are the causes? An abscess occurs when germs, often bacteria, pass through your skin and cause an infection. This may be caused by:  A scrape or cut on your skin.  A puncture wound through your skin, including a needle injection.  Blocked oil or sweat glands.  Blocked and infected hair follicles.  A cyst that forms beneath your skin (sebaceous cyst) and becomes infected. What increases the risk? This condition is more likely to develop in people who:  Have a weak body defense system (immune system).  Have diabetes.  Have dry and irritated skin.  Get frequent injections or use illegal IV drugs.  Have a foreign body in a wound, such as a splinter.  Have problems with their lymph system or veins. What are the signs or symptoms? An abscess may start as a painful, firm bump under the skin. Over time, the abscess may get larger or become softer. Pus may appear at the top of the abscess, causing pressure and pain. It may eventually break through the skin and drain. Other symptoms include:  Redness.  Warmth.  Swelling.  Tenderness.  A sore on the skin. How is this diagnosed? This condition is diagnosed based on your medical history and a physical exam. A sample of pus may be taken from the abscess to find out what is causing the infection and what antibiotics can be used to treat it. You also may have:  Blood tests to look for signs of infection or spread of an infection to your  blood.  Imaging studies such as ultrasound, CT scan, or MRI if the abscess is deep. How is this treated? Small abscesses that drain on their own may not need treatment. Treatment for an abscess that does not rupture on its own may include:  Warm compresses applied to the area several times per day.  Incision and drainage. Your health care provider will make an incision to open the abscess and will remove pus and any foreign body or dead tissue. The incision area may be packed with gauze to keep it open for a few days while it heals.  Antibiotic medicines to treat infection. For a severe abscess, you may first get antibiotics through an IV and then change to oral antibiotics. Follow these instructions at home: Abscess Care   If you have an abscess that has not drained, place a warm, clean, wet washcloth over the abscess several times a day. Do this as told by your health care provider.  Follow instructions from your health care provider about how to take care of your abscess. Make sure you:  Cover the abscess with a bandage (dressing).  Change your dressing or gauze as told by your health care provider.  Wash your hands with soap and water before you change the dressing or gauze. If soap and water are not available, use hand sanitizer.  Check your abscess every day for signs of a worsening infection. Check for:  More redness, swelling, or   pain.  More fluid or blood.  Warmth.  More pus or a bad smell. Medicines   Take over-the-counter and prescription medicines only as told by your health care provider.  If you were prescribed an antibiotic medicine, take it as told by your health care provider. Do not stop taking the antibiotic even if you start to feel better. General instructions   To avoid spreading the infection:  Do not share personal care items, towels, or hot tubs with others.  Avoid making skin contact with other people.  Keep all follow-up visits as told by your  health care provider. This is important. Contact a health care provider if:  You have more redness, swelling, or pain around your abscess.  You have more fluid or blood coming from your abscess.  Your abscess feels warm to the touch.  You have more pus or a bad smell coming from your abscess.  You have a fever.  You have muscle aches.  You have chills or a general ill feeling. Get help right away if:  You have severe pain.  You see red streaks on your skin spreading away from the abscess. This information is not intended to replace advice given to you by your health care provider. Make sure you discuss any questions you have with your health care provider. Document Released: 02/04/2005 Document Revised: 12/22/2015 Document Reviewed: 03/06/2015 Elsevier Interactive Patient Education  2017 Elsevier Inc.  

## 2016-04-17 NOTE — Progress Notes (Signed)
   Subjective:    Patient ID: Colleen Gonzalez, female    DOB: [birth year], 50 y.o.   MRN: [id number]  HPI PT presents to the office today with an abscess in her right groin that she notice two weeks ago. Pt states it has become larger, more painful. Denies any discharge, fever, or redness.    Review of Systems  Skin:       Abscess   All other systems reviewed and are negative.      Objective:   Physical Exam  Constitutional: She appears well-developed and well-nourished.  Cardiovascular: Normal rate, regular rhythm, normal heart sounds and intact distal pulses.   Pulmonary/Chest: Effort normal and breath sounds normal.  Abdominal: Soft. Bowel sounds are normal.  Skin: Skin is warm and dry.  Small hard abscess in right groin, no discharge, erythemas present      BP (!) 154/101   Pulse 76   Temp 98.9 F (37.2 C)   Resp 16   Ht 5\' 3"  (1.6 m)   Wt 195 lb 9.6 oz (88.7 kg)   BMI 34.65 kg/m      Assessment & Plan:  1. Abscess -Warm compresses -Do not squeeze  -RTO prn  - sulfamethoxazole-trimethoprim (BACTRIM DS) 800-160 MG tablet; Take 1 tablet by mouth 2 (two) times daily.  Dispense: 14 tablet; Refill: 0  Evelina Dun, FNP

## 2016-05-08 ENCOUNTER — Ambulatory Visit (INDEPENDENT_AMBULATORY_CARE_PROVIDER_SITE_OTHER): Payer: 59 | Admitting: Gynecology

## 2016-05-08 ENCOUNTER — Encounter: Payer: Self-pay | Admitting: Gynecology

## 2016-05-08 ENCOUNTER — Telehealth: Payer: Self-pay | Admitting: *Deleted

## 2016-05-08 VITALS — BP 124/80

## 2016-05-08 DIAGNOSIS — N6320 Unspecified lump in the left breast, unspecified quadrant: Secondary | ICD-10-CM | POA: Diagnosis not present

## 2016-05-08 DIAGNOSIS — N63 Unspecified lump in unspecified breast: Secondary | ICD-10-CM

## 2016-05-08 NOTE — Telephone Encounter (Signed)
Pt called c/o new left breast lump, advised to schedule office visit with provider, pt coming today at 11:30am front desk okayed.

## 2016-05-08 NOTE — Telephone Encounter (Signed)
-----   Message from Terrance Mass, MD sent at 05/08/2016 12:01 PM EST ----- Anderson Malta please schedule diagnostic mammogram of left breat for this patient with breast mass 6'oclock inferior to nipple. Breast center

## 2016-05-08 NOTE — Telephone Encounter (Signed)
Appointment at breast center on 05/15/16 @ 8:20am left message for pt to call.

## 2016-05-08 NOTE — Progress Notes (Signed)
    patient is a 50 year old gravida 0 that presented to the office today stating that a few days ago she noticed a tender nodular area right underneath her left nipple. She denied any nipple discharge or any recent trauma or injuries. She denied any first line relative of breast cancer. Patient had a mammogram July of this year and the following was reported:  FINDINGS: Whole breast 3D views in the CC and MLO projections show a circumscribed oval nodule projecting over the left pectoralis muscle with central fatty hilum, consistent with a benign intramammary lymph node. This appears stable and unchanged compared to multiple prior mammograms dating back to 2009. No suspicious findings. Mammographic images were processed with CAD. IMPRESSION: Stable benign intramammary lymph node left breast. No evidence of malignancy. RECOMMENDATION: Screening mammogram in one year  Both breasts were examined sitting supine position. No nipple inversion no skin discoloration no supraclavicular axillary lymphadenopathy on either breast. Right breast no palpable masses or tenderness left breast directly underneath the left nipple there was a 1-1/2 cm nodule slightly firm and tender on palpation.  Physical Exam  Pulmonary/Chest:    50 year old patient with nodular lesion underneath the left nipple be referred to the radiologist where she had her mammogram in July of this year for a diagnostic mammogram of this area.

## 2016-05-12 NOTE — Telephone Encounter (Signed)
Pt informed with the below note. 

## 2016-05-15 ENCOUNTER — Ambulatory Visit
Admission: RE | Admit: 2016-05-15 | Discharge: 2016-05-15 | Disposition: A | Discharge status: Home / Self Care | Payer: 59 | Source: Ambulatory Visit | Attending: Gynecology | Admitting: Gynecology

## 2016-05-15 ENCOUNTER — Ambulatory Visit: Payer: 59 | Admitting: Cardiology

## 2016-05-15 ENCOUNTER — Telehealth: Payer: Self-pay

## 2016-05-15 DIAGNOSIS — N63 Unspecified lump in unspecified breast: Secondary | ICD-10-CM

## 2016-05-15 NOTE — Telephone Encounter (Signed)
I spoke with Dr. Moshe Salisbury and he said he is fine with radiologist prescribing antibiotic but to tell patient if not gone within a week after antibiotic to come for office visit to let Dr. Moshe Salisbury check breast inf.  Truman Hayward will relay this to the patient.

## 2016-05-15 NOTE — Telephone Encounter (Signed)
Patient is at [facility] today for L br mammo and u/s . She has a superficial left breast infection.  She needs antibiotic. They want to know if you are okay with their MD prescribing it or do you want to handle this. NDA

## 2016-06-05 ENCOUNTER — Ambulatory Visit: Payer: 59 | Admitting: Cardiology

## 2016-06-09 ENCOUNTER — Other Ambulatory Visit: Payer: Self-pay | Admitting: Gynecology

## 2016-06-09 DIAGNOSIS — N61 Mastitis without abscess: Secondary | ICD-10-CM

## 2016-06-12 ENCOUNTER — Ambulatory Visit
Admission: RE | Admit: 2016-06-12 | Discharge: 2016-06-12 | Disposition: A | Discharge status: Home / Self Care | Payer: 59 | Source: Ambulatory Visit | Attending: Gynecology | Admitting: Gynecology

## 2016-06-12 DIAGNOSIS — N61 Mastitis without abscess: Secondary | ICD-10-CM

## 2016-07-31 ENCOUNTER — Other Ambulatory Visit: Payer: Self-pay | Admitting: Gynecology

## 2016-08-28 ENCOUNTER — Ambulatory Visit: Payer: 59 | Admitting: Cardiovascular Disease

## 2016-09-18 ENCOUNTER — Encounter: Payer: Self-pay | Admitting: Cardiovascular Disease

## 2016-09-18 ENCOUNTER — Ambulatory Visit (INDEPENDENT_AMBULATORY_CARE_PROVIDER_SITE_OTHER): Payer: 59 | Admitting: Gynecology

## 2016-09-18 ENCOUNTER — Ambulatory Visit (INDEPENDENT_AMBULATORY_CARE_PROVIDER_SITE_OTHER): Payer: 59 | Admitting: Cardiovascular Disease

## 2016-09-18 ENCOUNTER — Encounter: Payer: Self-pay | Admitting: Gynecology

## 2016-09-18 ENCOUNTER — Telehealth: Payer: Self-pay | Admitting: *Deleted

## 2016-09-18 VITALS — BP 136/82 | Ht 63.25 in | Wt 197.0 lb

## 2016-09-18 VITALS — BP 158/102 | HR 60 | Ht 63.0 in | Wt 198.0 lb

## 2016-09-18 DIAGNOSIS — I10 Essential (primary) hypertension: Secondary | ICD-10-CM | POA: Diagnosis not present

## 2016-09-18 DIAGNOSIS — L720 Epidermal cyst: Secondary | ICD-10-CM | POA: Diagnosis not present

## 2016-09-18 DIAGNOSIS — L249 Irritant contact dermatitis, unspecified cause: Secondary | ICD-10-CM | POA: Insufficient documentation

## 2016-09-18 MED ORDER — NYSTATIN-TRIAMCINOLONE 100000-0.1 UNIT/GM-% EX CREA: 1.0000 "application " | TOPICAL_CREAM | Freq: Three times a day (TID) | CUTANEOUS | 2 refills | Status: DC

## 2016-09-18 NOTE — Progress Notes (Signed)
Cardiology Office Note   Date:  09/20/2016   ID:  Colleen Gonzalez, DOB 13-Dec-1965, MRN [id number]  PCP:  Sharion Balloon, FNP  Cardiologist:   Skeet Latch, MD   Chief Complaint  Patient presents with  . New Evaluation    former pt of Dr. Debara Pickett  pt states no Sx.      History of Present Illness: Colleen Gonzalez is a 51 y.o. female with tricuspid regurgitation and family history of premature CAD who presents for follow up.  She was previously a patient of Dr. Rollene Fare and then Dr. Debara Pickett.  She was last seen in 2014.  She was initially seen for a prevention exam given her mother's history of premature CAD. Her other had a heart attack at age 35. Colleen Gonzalez had an echo 06/2007 that revealed LVEF >55% and mild tricuspid regurgitation.  She had a repeat echo 12/2009 that revealed mild-moderate tricuspid regurgitation and a small pericardial effusion.  Colleen Gonzalez has been doing well.  For the last three months she has been exercising at the gym three times per week for one hour each time.  She feels well with exercise and denies chest pain or shortness of breath with exertion.  Her only complaint is weight gain.  She attributes this to eating out regularly.  Ms. Bethune doesn't usually check her BP at home, but when she does it is usually within normal limits.  She denies headaches, chest pain, shortness of breath, lower extremity edema, orthopnea, or PND.   Past Medical History:  Diagnosis Date  . Acne   . Anemia   . Eczema     Past Surgical History:  Procedure Laterality Date  . PELVIC LAPAROSCOPY  10/30/1993   MYOMECTOMY, LYSIS OF ADHESIONS..  . TRANSTHORACIC ECHOCARDIOGRAM  01/03/2010   EF=>55%; mild-mod TR; trace AV regurg      Current Outpatient Prescriptions  Medication Sig Dispense Refill  . ferrous sulfate 325 (65 FE) MG tablet Take 325 mg by mouth daily with breakfast.    . ibuprofen (ADVIL,MOTRIN) 200 MG tablet Take 200 mg by mouth every 6 (six) hours as needed.      . norethindrone-ethinyl estradiol (JUNEL FE,GILDESS FE,LOESTRIN FE) 1-20 MG-MCG tablet TAKE ONE (1) TABLET EACH DAY 28 tablet 1  . nystatin-triamcinolone (MYCOLOG II) cream Apply 1 application topically 3 (three) times daily. 30 g 2   No current facility-administered medications for this visit.     Allergies:   Patient has no known allergies.    Social History:  The patient  reports that she has never smoked. She has never used smokeless tobacco. She reports that she drinks alcohol. She reports that she does not use drugs.   Family History:  The patient's  family history includes Breast cancer (age of onset: 55) in her paternal aunt; Diabetes in her father; Heart disease in her mother; Hypertension in her father and maternal grandmother; Other in her mother; Skin cancer in her mother; Stroke in her father and maternal grandfather; Uterine cancer in her maternal grandmother.    ROS:  Please see the history of present illness.   Otherwise, review of systems are positive for none.   All other systems are reviewed and negative.    PHYSICAL EXAM: VS:  BP (!) 158/102 (BP Location: Left Arm)   Pulse 60   Ht 5\' 3"  (1.6 m)   Wt 89.8 kg (198 lb)   BMI 35.07 kg/m  , BMI Body mass index is 35.07  kg/m. GENERAL:  Well appearing HEENT:  Pupils equal round and reactive, fundi not visualized, oral mucosa unremarkable NECK:  No jugular venous distention, waveform within normal limits, carotid upstroke brisk and symmetric, no bruits, no thyromegaly LYMPHATICS:  No cervical adenopathy LUNGS:  Clear to auscultation bilaterally HEART:  RRR.  PMI not displaced or sustained,S1 and S2 within normal limits, no S3, no S4, no clicks, no rubs, II/VI systolic murmur at the LLSB ABD:  Flat, positive bowel sounds normal in frequency in pitch, no bruits, no rebound, no guarding, no midline pulsatile mass, no hepatomegaly, no splenomegaly EXT:  2 plus pulses throughout, no edema, no cyanosis no clubbing SKIN:  No  rashes no nodules NEURO:  Cranial nerves II through XII grossly intact, motor grossly intact throughout PSYCH:  Cognitively intact, oriented to person place and time    EKG:  EKG is ordered today. The ekg ordered today demonstrates sinus rhythm rate 60 bpm   Recent Labs: No results found for requested labs within last 8760 hours.    Lipid Panel    Component Value Date/Time   CHOL 161 09/13/2015 1445   TRIG 72 09/13/2015 1445   HDL 62 09/13/2015 1445   CHOLHDL 2.6 09/13/2015 1445   VLDL 14 09/13/2015 1445   LDLCALC 85 09/13/2015 1445      Wt Readings from Last 3 Encounters:  09/18/16 89.4 kg (197 lb)  09/18/16 89.8 kg (198 lb)  04/17/16 88.7 kg (195 lb 9.6 oz)      ASSESSMENT AND PLAN:  # Elevated BP: Ms. Colleen Gonzalez' blood pressure is quite elevated today.  She thinks that it has been normal at home and this it is elevated today because she is anxious about her appointment.  I have asked her to keep a log of her blood pressure twice daily and bring it to a followup appointment with our pharmacists in the hypertension clinic.    # CV Disease Prevention: # Family history of premature CAD: Addressing blood pressure as above.  Lipids are well-controlled 09/2015.  Her cholesterol should be checked at least once every 3 years.  She was congratulated on her exercise.  Recommend working on her diet for weight loss.     Current medicines are reviewed at length with the patient today.  The patient does not have concerns regarding medicines.  The following changes have been made:  no change  Labs/ tests ordered today include:   Orders Placed This Encounter  Procedures  . EKG 12-Lead     Disposition:   FU with Alwilda Gilland C. Oval Linsey, MD, [facility] in 1 year.  Pharmacy in 2 weeks.    This note was written with the assistance of speech recognition software.  Please excuse any transcriptional errors.  Signed, Christepher Melchior C. Oval Linsey, MD, [facility]  09/20/2016 5:05 PM    [place]

## 2016-09-18 NOTE — Progress Notes (Signed)
   Patient is a 51 year old that presented to the office today complaining of several days of itching on her left breast. She denies palpating any abnormal masses or any nipple discharge or any recent trauma. Review of her record indicated she was seen in the office in December of last year concerned of a nodular area underneath the areolar tissue of her left breast that she was sent for diagnostic mammogram and ultrasound and the following results were reported by the radiologist:  IMPRESSION: 1. Focal left breast mastitis and possible early abscess in the areolar region of the left breast, 6 o'clock location. 2. No mammographic or ultrasound evidence for malignancy.  RECOMMENDATION: 1. Antibiotic therapy. Patient is given a prescription for doxycycline 100 mg 1 p.o. b.i.d. X 7 days. 2. Followup left breast ultrasound in 2 weeks to document improvement of changes described above.  Her follow-up ultrasound demonstrated the following: IMPRESSION: Significant clinical improvement in the patient's symptoms, with decrease in the area of skin thickening in the inferior left areola on today's ultrasound compared to prior. Findings are consistent with resolving infection/inflammation.  RECOMMENDATION: Bilateral screening mammogram is recommended in July 2018 unless new or progressive areas of concern warrant earlier evaluation  Patient is on no hormone replacement therapy.  Both breasts were examined sitting supine position both breasts are symmetrical in appearance no palpable masses or tenderness no skin discoloration no nipple inversion no supraclavicular or axillary lymphadenopathy although on her left breast there was a subcutaneous nodular area possibly an epidermal inclusion cyst nontender  Assessment/plan: Possible contact dermatitis and a reactive lymph node versus 7 epidermal inclusion cyst on her left axilla. Recent ultrasound and mammogram as described above. Patient will be  placed on mytrex cream to apply 2-3 times a day for the next week to 10 days and she'll return back to the office in the month for her annual exam will we will reexamine that area.

## 2016-09-18 NOTE — Addendum Note (Signed)
Addended by: Dorothyann Gibbs on: 09/18/2016 01:41 PM   Modules accepted: Orders

## 2016-09-18 NOTE — Patient Instructions (Addendum)
Medication Instructions:  Your physician recommends that you continue on your current medications as directed. Please refer to the Current Medication list given to you today.  Labwork: none  Testing/Procedures: none  Follow-Up: Your physician recommends that you schedule a follow-up appointment in: [place] physician wants you to follow-up in: [place] will receive a reminder letter in the mail two months in advance. If you don't receive a letter, please call our office to schedule the follow-up appointment.  Any Other Special Instructions Will Be Listed Below (If Applicable). LOG BLOOD PRESSURES TWICE A DAY  FOR 2 WEEKS AND BRING TO YOUR APPOINTMENT WITH PHARMACIST   If you need a refill on your cardiac medications before your next appointment, please call your pharmacy.

## 2016-09-18 NOTE — Patient Instructions (Signed)
Nystatin; Triamcinolone cream or ointment What is this medicine? NYSTATIN; TRIAMCINOLONE (nye STAT in; trye am SIN [place]) is a combination of an antifungal medicine and a steroid. It is used to treat certain kinds of fungal or yeast infections of the skin. This medicine may be used for other purposes; ask your health care provider or pharmacist if you have questions. COMMON BRAND NAME(S): Myco-Triacet-II, Mycogen-II, Mycolog II, Mytrex, N.T.A. What should I tell my health care provider before I take this medicine? They need to know if you have any of these conditions: -large areas of burned or damaged skin -skin wasting or thinning -peripheral vascular disease or poor circulation -an unusual or allergic reaction to nystatin, triamcinolone, other corticosteroids, other medicines, foods, dyes, or preservatives -pregnant or trying to get pregnant -breast-feeding How should I use this medicine? This medicine is for external use only. Do not take by mouth. Follow the directions on the prescription label. Wash your hands before and after use. If treating hand or nail infections, wash hands before use only. Apply a thin layer of this medicine to the affected area and rub in gently. Do not use on healthy skin or over large areas of skin. Do not get this medicine in your eyes. If you do, rinse out with plenty of cool tap water. When applying to the groin area, apply a limited amount and do not use for longer than 2 weeks unless directed to by your doctor or health care professional. Do not cover or wrap the treated area with an airtight bandage (such as a plastic bandage). Use the full course of treatment prescribed, even if you think the infection is getting better. Use at regular intervals. Do not use your medicine more often than directed. Do not use this medicine for any condition other than the one for which it was prescribed. Talk to your pediatrician regarding the use of this medicine in children.  While this drug may be prescribed for selected conditions, precautions do apply. Children being treated in the diaper area should not wear tight-fitting diapers or plastic pants. Elderly patients are more likely to have damaged skin through aging, and this may increase side effects. This medicine should only be used for brief periods and infrequently in older patients. Overdosage: If you think you have taken too much of this medicine contact a poison control center or emergency room at once. NOTE: This medicine is only for you. Do not share this medicine with others. What if I miss a dose? If you miss a dose, use it as soon as you can. If it is almost time for your next dose, use only that dose. Do not use double or extra doses. What may interact with this medicine? Interactions are not expected. Do not use any other skin products on the affected area without telling your doctor or health care professional. This list may not describe all possible interactions. Give your health care provider a list of all the medicines, herbs, non-prescription drugs, or dietary supplements you use. Also tell them if you smoke, drink alcohol, or use illegal drugs. Some items may interact with your medicine. What should I watch for while using this medicine? Tell your doctor or health care professional if your symptoms do not start to get better within 1 week when treating the groin area or within 2 weeks when treating the feet. . Tell your doctor or health care professional if you develop sores or blisters that do not heal properly. If your skin  infection returns after stopping this medicine, contact your doctor or health care professional. If you are using this medicine to treat an infection in the groin area, do not wear underwear that is tight-fitting or made from synthetic fibers such as rayon or nylon. Instead, wear loose-fitting, cotton underwear. Also dry the area completely after bathing. What side effects may I  notice from receiving this medicine? Side effects that you should report to your doctor or health care professional as soon as possible: -burning or itching of the skin -dark red spots on the skin -loss of feeling on skin -painful, red, pus-filled blisters in hair follicles -skin infection -thinning of the skin or sunburn: more likely if applied to the face Side effects that usually do not require medical attention (report to your doctor or health care professional if they continue or are bothersome): -dry or peeling skin -skin irritation This list may not describe all possible side effects. Call your doctor for medical advice about side effects. You may report side effects to FDA at 1-800-FDA-1088. Where should I keep my medicine? Keep out of the reach of children. Store at room temperature between 15 and 30 degrees C (59 and 86 degrees F). Do not freeze. Throw away any unused medicine after the expiration date. NOTE: This sheet is a summary. It may not cover all possible information. If you have questions about this medicine, talk to your doctor, pharmacist, or health care provider.  2018 Elsevier/Gold Standard (2007-11-18 17:29:26) Contact Dermatitis Dermatitis is redness, soreness, and swelling (inflammation) of the skin. Contact dermatitis is a reaction to certain substances that touch the skin. You either touched something that irritated your skin, or you have allergies to something you touched. Follow these instructions at home: [place] your skin as needed.  Apply cool compresses to the affected areas.  Try taking a bath with:  Epsom salts. Follow the instructions on the package. You can get these at a pharmacy or grocery store.  Baking soda. Pour a small amount into the bath as told by your doctor.  Colloidal oatmeal. Follow the instructions on the package. You can get this at a pharmacy or grocery store.  Try applying baking soda paste to your skin. Stir  water into baking soda until it looks like paste.  Do not scratch your skin.  Bathe less often.  Bathe in lukewarm water. Avoid using hot water. Medicines   Take or apply over-the-counter and prescription medicines only as told by your doctor.  If you were prescribed an antibiotic medicine, take or apply your antibiotic as told by your doctor. Do not stop taking the antibiotic even if your condition starts to get better. General instructions   Keep all follow-up visits as told by your doctor. This is important.  Avoid the substance that caused your reaction. If you do not know what caused it, keep a journal to try to track what caused it. Write down:  What you eat.  What cosmetic products you use.  What you drink.  What you wear in the affected area. This includes jewelry.  If you were given a bandage (dressing), take care of it as told by your doctor. This includes when to change and remove it. Contact a doctor if:  You do not get better with treatment.  Your condition gets worse.  You have signs of infection such as:  Swelling.  Tenderness.  Redness.  Soreness.  Warmth.  You have a fever.  You have new  symptoms. Get help right away if:  You have a very bad headache.  You have neck pain.  Your neck is stiff.  You throw up (vomit).  You feel very sleepy.  You see red streaks coming from the affected area.  Your bone or joint underneath the affected area becomes painful after the skin has healed.  The affected area turns darker.  You have trouble breathing. This information is not intended to replace advice given to you by your health care provider. Make sure you discuss any questions you have with your health care provider. Document Released: 02/22/2009 Document Revised: 10/03/2015 Document Reviewed: 09/12/2014 Elsevier Interactive Patient Education  2017 [employer].

## 2016-09-18 NOTE — Telephone Encounter (Signed)
Pt called c/o breast pain in left breast, asked if order could be sent to breast center. I advised pt OV required for breast problem, transferred to front desk.

## 2016-09-20 ENCOUNTER — Encounter: Payer: Self-pay | Admitting: Cardiovascular Disease

## 2016-09-23 ENCOUNTER — Encounter: Payer: Self-pay | Admitting: Gynecology

## 2016-09-25 ENCOUNTER — Other Ambulatory Visit: Payer: Self-pay | Admitting: Gynecology

## 2016-09-29 ENCOUNTER — Encounter: Payer: Self-pay | Admitting: Gynecology

## 2016-09-29 ENCOUNTER — Ambulatory Visit (INDEPENDENT_AMBULATORY_CARE_PROVIDER_SITE_OTHER): Payer: 59 | Admitting: Gynecology

## 2016-09-29 VITALS — BP 124/76 | Ht 64.0 in | Wt 196.0 lb

## 2016-09-29 DIAGNOSIS — D251 Intramural leiomyoma of uterus: Secondary | ICD-10-CM

## 2016-09-29 DIAGNOSIS — R635 Abnormal weight gain: Secondary | ICD-10-CM | POA: Diagnosis not present

## 2016-09-29 DIAGNOSIS — Z01419 Encounter for gynecological examination (general) (routine) without abnormal findings: Secondary | ICD-10-CM | POA: Diagnosis not present

## 2016-09-29 DIAGNOSIS — R102 Pelvic and perineal pain: Secondary | ICD-10-CM | POA: Diagnosis not present

## 2016-09-29 DIAGNOSIS — L720 Epidermal cyst: Secondary | ICD-10-CM | POA: Diagnosis not present

## 2016-09-29 LAB — CBC WITH DIFFERENTIAL/PLATELET
Basophils Absolute: 54 cells/uL (ref 0–200)
Basophils Relative: 1 %
EOS ABS: 918 {cells}/uL — AB (ref 15–500)
Eosinophils Relative: 17 %
HEMATOCRIT: 37.6 % (ref 35.0–45.0)
Hemoglobin: 11.7 g/dL (ref 11.7–15.5)
LYMPHS PCT: 50 %
Lymphs Abs: 2700 cells/uL (ref 850–3900)
MCH: 27.3 pg (ref 27.0–33.0)
MCHC: 31.1 g/dL — ABNORMAL LOW (ref 32.0–36.0)
MCV: 87.9 fL (ref 80.0–100.0)
MONO ABS: 324 {cells}/uL (ref 200–950)
MPV: 9.7 fL (ref 7.5–12.5)
Monocytes Relative: 6 %
NEUTROS PCT: 26 %
Neutro Abs: 1404 cells/uL — ABNORMAL LOW (ref 1500–7800)
Platelets: 367 10*3/uL (ref 140–400)
RBC: 4.28 MIL/uL (ref 3.80–5.10)
RDW: 14.3 % (ref 11.0–15.0)
WBC: 5.4 10*3/uL (ref 3.8–10.8)

## 2016-09-29 LAB — COMPREHENSIVE METABOLIC PANEL
ALBUMIN: 4.3 g/dL (ref 3.6–5.1)
ALT: 13 U/L (ref 6–29)
AST: 12 U/L (ref 10–35)
Alkaline Phosphatase: 76 U/L (ref 33–115)
BUN: 14 mg/dL (ref 7–25)
CALCIUM: 9.1 mg/dL (ref 8.6–10.2)
CHLORIDE: 107 mmol/L (ref 98–110)
CO2: 21 mmol/L (ref 20–31)
Creat: 1.06 mg/dL (ref 0.50–1.10)
GLUCOSE: 81 mg/dL (ref 65–99)
POTASSIUM: 4.6 mmol/L (ref 3.5–5.3)
Sodium: 138 mmol/L (ref 135–146)
Total Bilirubin: 0.5 mg/dL (ref 0.2–1.2)
Total Protein: 7 g/dL (ref 6.1–8.1)

## 2016-09-29 LAB — LIPID PANEL
CHOL/HDL RATIO: 2.6 ratio (ref <5.0)
Cholesterol: 156 mg/dL (ref <200)
HDL: 61 mg/dL (ref >50)
LDL CALC: 80 mg/dL (ref <100)
TRIGLYCERIDES: 77 mg/dL (ref <150)
VLDL: 15 mg/dL (ref <30)

## 2016-09-29 MED ORDER — DOXYCYCLINE HYCLATE 100 MG PO CAPS: 100.0000 mg | ORAL_CAPSULE | Freq: Two times a day (BID) | ORAL | 0 refills | Status: DC

## 2016-09-29 MED ORDER — NORETHIN ACE-ETH ESTRAD-FE 1-20 MG-MCG PO TABS: 1.0000 | ORAL_TABLET | Freq: Every day | ORAL | 11 refills | Status: DC

## 2016-09-29 MED ORDER — CLINDAMYCIN PHOSPHATE 1 % EX GEL: Freq: Two times a day (BID) | CUTANEOUS | 0 refills | Status: DC

## 2016-09-29 NOTE — Progress Notes (Signed)
Colleen Gonzalez 11/02/1965 [id number]   History:    50 y.o.  for annual gyn exam with the only complaint is lower abdominal discomfort for the past week on and off. She is on a 20 g oral contraceptive pill and having normal menstrual cycle. Review of her record indicates she was seen in of this yearMay  because of her concern of a nodularity on her left axilla and she was placed on mytrex cream 2-3 times a day for 7-10 days. she was instructed to refrain from shaving during that time. She states is still present but not as bothersome as before. She did have a normal mammogram in January of this year. She'll normal colonoscopy in 2015 and she is on a 10 year recall. Patient with no past history of any abnormal Pap smears. Patient does have past history of fibroid uterus although small.   Past medical history,surgical history, family history and social history were all reviewed and documented in the EPIC chart.  Gynecologic History Patient's last menstrual period was 09/21/2016. Contraception: OCP (estrogen/progesterone) Last Pap:  2015ults were: normal Last mammogram:  See aboveults were: normal  Obstetric History OB History  Gravida Para Term Preterm AB Living  0            SAB TAB Ectopic Multiple Live Births                    ROS: A ROS was performed and pertinent positives and negatives are included in the history.  GENERAL: No fevers or chills. HEENT: No change in vision, no earache, sore throat or sinus congestion. NECK: No pain or stiffness. CARDIOVASCULAR: No chest pain or pressure. No palpitations. PULMONARY: No shortness of breath, cough or wheeze. GASTROINTESTINAL: No abdominal pain, nausea, vomiting or diarrhea, melena or bright red blood per rectum. GENITOURINARY: No urinary frequency, urgency, hesitancy or dysuria. MUSCULOSKELETAL: No joint or muscle pain, no back pain, no recent trauma. DERMATOLOGIC: No rash, no itching, no lesions. ENDOCRINE: No polyuria, polydipsia, no  heat or cold intolerance. No recent change in weight. HEMATOLOGICAL: No anemia or easy bruising or bleeding. NEUROLOGIC: No headache, seizures, numbness, tingling or weakness. PSYCHIATRIC: No depression, no loss of interest in normal activity or change in sleep pattern.     Exam: chaperone present  BP 124/76   Ht 5\' 4"  (1.626 m)   Wt 196 lb (88.9 kg)   LMP 09/21/2016   BMI 33.64 kg/m   Body mass index is 33.64 kg/m.  General appearance : Well developed well nourished female. No acute distress HEENT: Eyes: no retinal hemorrhage or exudates,  Neck supple, trachea midline, no carotid bruits, no thyroidmegaly Lungs: Clear to auscultation, no rhonchi or wheezes, or rib retractions  Heart: Regular rate and rhythm, no murmurs or gallops Breast:Examined in sitting and supine position were symmetrical in appearance, no palpable masses or tenderness,  no skin retraction, no nipple inversion, no nipple discharge, no skin discoloration, epidermal inclusion cyst versus sebaceous cyst nonerythematous small nontender left axilla  Abdomen:  no palpable masses or tenderness, no rebound or guarding Extremities: no edema or skin discoloration or tenderness  Pelvic:  [place]olin, Urethra, Skene Glands: Within normal limits             Vagina: No gross lesions or discharge  Cervix: No gross lesions or discharge  Uterus   upper limits of normal slightly irregularnder and mobile  Adnexa  Without masses or tenderness  Anus and perineum  normal  Rectovaginal  normal sphincter tone without palpated masses or tenderness             Hemoccult not indicated     Assessment/Plan:  51 y.o. female for annual examthere was treated a few months ago with an antifungal agent we are going to place her on clindamycin cream to apply twice a day underneath her left axilla and place her on Vibramycin 100 mg twice a day for 2 weeks and have her return to the office at that time the following screening blood work ordered  today: Comprehensive metabolic panel, fasting lipid profile, TSH, CBC, and urinalysis. An ultrasound will be ordered in 2 weeks also for follow-up    Terrance Mass MD, 2:40 PM 09/29/2016

## 2016-09-29 NOTE — Addendum Note (Signed)
Addended by: Nelva Nay on: 09/29/2016 03:26 PM   Modules accepted: Orders

## 2016-09-30 ENCOUNTER — Ambulatory Visit (INDEPENDENT_AMBULATORY_CARE_PROVIDER_SITE_OTHER): Payer: 59

## 2016-09-30 ENCOUNTER — Ambulatory Visit (INDEPENDENT_AMBULATORY_CARE_PROVIDER_SITE_OTHER): Payer: 59 | Admitting: Gynecology

## 2016-09-30 ENCOUNTER — Encounter: Payer: Self-pay | Admitting: Gynecology

## 2016-09-30 VITALS — BP 130/80

## 2016-09-30 DIAGNOSIS — D251 Intramural leiomyoma of uterus: Secondary | ICD-10-CM

## 2016-09-30 DIAGNOSIS — R102 Pelvic and perineal pain: Secondary | ICD-10-CM | POA: Diagnosis not present

## 2016-09-30 LAB — URINALYSIS W MICROSCOPIC + REFLEX CULTURE
Bacteria, UA: NONE SEEN [HPF]
Bilirubin Urine: NEGATIVE
Casts: NONE SEEN [LPF]
Crystals: NONE SEEN [HPF]
Glucose, UA: NEGATIVE
Hgb urine dipstick: NEGATIVE
Ketones, ur: NEGATIVE
Leukocytes, UA: NEGATIVE
Nitrite: NEGATIVE
Protein, ur: NEGATIVE
RBC / HPF: NONE SEEN RBC/HPF (ref <=2)
Specific Gravity, Urine: 1.024 (ref 1.001–1.035)
WBC, UA: NONE SEEN WBC/HPF (ref <=5)
Yeast: NONE SEEN [HPF]
pH: 6 (ref 5.0–8.0)

## 2016-09-30 LAB — PAP IG W/ RFLX HPV ASCU

## 2016-09-30 LAB — TSH: TSH: 1.91 m[IU]/L

## 2016-09-30 NOTE — Progress Notes (Signed)
   Patient is a 51 year old that was seen the office is here for annual exam and a complaining of on and off some low abdominal discomfort. Patient with past history of fibroid uterus was here to discuss the ultrasound.  Ultrasound: Uterus measured 8.8 x 5.6 x 4.1 cm with endometrial stripe of 1.8 mm. Patient had a total of 5 fibroids largest 1 measuring 25 x 21 mm unchanged in 2017. She did have a small ovarian thinwall echo-free avascular cyst 14 x 18 mm 19 x 15 mm. Left R was normal and no fluid cul-de-sac.  Yesterday's office visit she was started on Vibramycin 100 mg twice a day for 2 weeks and a clindamycin cream to apply to the left axilla as a result of a sebaceous cysts and she scheduled to return back in 2 week to discuss a result. We also reviewed her recent blood work consisting of a CBC, compresses a metabolic panel, fasting lipid profile TSH and urinalysis which were normal with exception that her CBC indicated her Eosinophil was 1404 and May of this year it was 43 and last year it was 1. Patient with history of allergies and eczema which may have attributed to this is I explained to the patient.  Greater than 90% of time was spent counseling correlating care for this patient. Time of consultation 10 minutes

## 2016-10-02 ENCOUNTER — Encounter: Payer: 59 | Admitting: Gynecology

## 2016-10-16 ENCOUNTER — Encounter: Payer: Self-pay | Admitting: Gynecology

## 2016-10-16 ENCOUNTER — Ambulatory Visit (INDEPENDENT_AMBULATORY_CARE_PROVIDER_SITE_OTHER): Payer: 59 | Admitting: Gynecology

## 2016-10-16 ENCOUNTER — Ambulatory Visit: Payer: 59

## 2016-10-16 VITALS — BP 124/80

## 2016-10-16 DIAGNOSIS — R2232 Localized swelling, mass and lump, left upper limb: Secondary | ICD-10-CM | POA: Diagnosis not present

## 2016-10-16 NOTE — Progress Notes (Signed)
   Patient is a 51 year old who was seen the office in May 23 of this year and was asked to return to the office for follow-up exam as a result of a left axillary nodule for which she was placed on Vibramycin 100 mg twice a day for 2 weeks and to apply clindamycin cream twice a week as well in the event of an infected sebaceous cyst. She had a normal mammogram in January of this year. She states that the area has completely resolved and she is asymptomatic.  Exam: Both breasts were examined sitting supine position. Both breasts are symmetrical in appearance. Last no discoloration no nipple inversion no supraclavicular axillary lymphadenopathy or any masses.  Assessment/plan completely resolution of infected left axillary sebaceous cyst patient otherwise scheduled to return to the office next May for annual exam or when necessary.

## 2016-10-19 ENCOUNTER — Other Ambulatory Visit: Payer: Self-pay | Admitting: Gynecology

## 2016-11-20 ENCOUNTER — Other Ambulatory Visit: Payer: Self-pay

## 2016-11-20 ENCOUNTER — Other Ambulatory Visit: Payer: Self-pay | Admitting: Obstetrics & Gynecology

## 2016-11-20 DIAGNOSIS — Z1231 Encounter for screening mammogram for malignant neoplasm of breast: Secondary | ICD-10-CM

## 2016-12-04 ENCOUNTER — Ambulatory Visit: Payer: 59

## 2016-12-25 ENCOUNTER — Ambulatory Visit
Admission: RE | Admit: 2016-12-25 | Discharge: 2016-12-25 | Disposition: A | Discharge status: Home / Self Care | Payer: 59 | Source: Ambulatory Visit | Attending: Obstetrics & Gynecology | Admitting: Obstetrics & Gynecology

## 2016-12-25 DIAGNOSIS — Z1231 Encounter for screening mammogram for malignant neoplasm of breast: Secondary | ICD-10-CM

## 2017-02-01 ENCOUNTER — Ambulatory Visit: Payer: 59

## 2017-03-12 ENCOUNTER — Other Ambulatory Visit: Payer: Self-pay | Admitting: *Deleted

## 2017-11-05 ENCOUNTER — Encounter: Payer: 59 | Admitting: Obstetrics & Gynecology

## 2017-11-19 ENCOUNTER — Other Ambulatory Visit: Payer: Self-pay | Admitting: Obstetrics & Gynecology

## 2017-11-19 DIAGNOSIS — Z1231 Encounter for screening mammogram for malignant neoplasm of breast: Secondary | ICD-10-CM

## 2017-12-27 ENCOUNTER — Other Ambulatory Visit: Payer: Self-pay

## 2017-12-27 MED ORDER — NORETHIN ACE-ETH ESTRAD-FE 1-20 MG-MCG PO TABS: 1.0000 | ORAL_TABLET | Freq: Every day | ORAL | 0 refills | Status: DC

## 2017-12-31 ENCOUNTER — Ambulatory Visit
Admission: RE | Admit: 2017-12-31 | Discharge: 2017-12-31 | Disposition: A | Discharge status: Home / Self Care | Payer: 59 | Source: Ambulatory Visit | Attending: Obstetrics & Gynecology | Admitting: Obstetrics & Gynecology

## 2017-12-31 ENCOUNTER — Ambulatory Visit: Payer: 59 | Admitting: Obstetrics & Gynecology

## 2017-12-31 ENCOUNTER — Encounter: Payer: 59 | Admitting: Obstetrics & Gynecology

## 2017-12-31 ENCOUNTER — Encounter: Payer: Self-pay | Admitting: Obstetrics & Gynecology

## 2017-12-31 VITALS — BP 136/88 | Ht 63.0 in | Wt 194.0 lb

## 2017-12-31 DIAGNOSIS — Z1231 Encounter for screening mammogram for malignant neoplasm of breast: Secondary | ICD-10-CM

## 2017-12-31 DIAGNOSIS — E6609 Other obesity due to excess calories: Secondary | ICD-10-CM

## 2017-12-31 DIAGNOSIS — Z01419 Encounter for gynecological examination (general) (routine) without abnormal findings: Secondary | ICD-10-CM

## 2017-12-31 DIAGNOSIS — Z3041 Encounter for surveillance of contraceptive pills: Secondary | ICD-10-CM | POA: Diagnosis not present

## 2017-12-31 DIAGNOSIS — Z6834 Body mass index (BMI) 34.0-34.9, adult: Secondary | ICD-10-CM | POA: Diagnosis not present

## 2017-12-31 MED ORDER — NORETHIN ACE-ETH ESTRAD-FE 1-20 MG-MCG PO TABS: 1.0000 | ORAL_TABLET | Freq: Every day | ORAL | 4 refills | Status: DC

## 2017-12-31 NOTE — Patient Instructions (Signed)
1. Well female exam with routine gynecological exam Pelvic exam stable with asymptomatic small fibroids.  Pap test negative in May 2018.  Will repeat every 3 years.  Breast exam normal.  Screening mammogram scheduled today.  Fasting health labs here today.  Colonoscopy negative in 2015. - CBC - Comp Met (CMET) - Lipid panel - TSH - VITAMIN D 25 Hydroxy (Vit-D Deficiency, Fractures)  2. Encounter for surveillance of contraceptive pills No symptom of menopause.  Needs contraception.  No contraindication to the birth control pill.  Doing well on Janel FE 1/20.  Same birth control pill represcribed.  Patient will follow-up at the end of the hormone free pills to check an [facility]. - [place]; Future  3. Class 1 obesity due to excess calories without serious comorbidity with body mass index (BMI) of 34.0 to 34.9 in adult Body mass index at 34.37.  Recommend a low calorie/low carb diet such as [employer].  Increase physical activity with aerobic activities 5 times a week and weightlifting every 2 days.  Other orders - norethindrone-ethinyl estradiol (JUNEL FE,GILDESS FE,LOESTRIN FE) 1-20 MG-MCG tablet; Take 1 tablet by mouth daily.  Colleen Gonzalez, it was a pleasure meeting you today!  I will inform you of your results as soon as they are available.

## 2017-12-31 NOTE — Progress Notes (Signed)
Colleen Gonzalez 08-31-1965 [id number]   History:    52 y.o. G0 Stable boyfriend  RP:  Established patient presenting for annual gyn exam   HPI: Well on birth control pill Junel FE 1/20.  Very light periods and occasionally skips.  No pelvic pain.  No pain with intercourse.  Urine and bowel movements normal.  Breasts normal.  Screening mammogram scheduled today.  Body mass index 34.37.  Stable since last year.  Would like to lose weight.  Ready to start on a low calorie diet and more physical activity.  Will do fasting health labs here today.  Colonoscopy negative in 2015.  Past medical history,surgical history, family history and social history were all reviewed and documented in the EPIC chart.  Gynecologic History No LMP recorded. (Menstrual status: Perimenopausal). Contraception: OCP (estrogen/progesterone) Last Pap: 09/2016. Results were: Negative Last mammogram: 12/2016. Results were: Negative.  Scheduled today for screening mammo. Bone Density: Never Colonoscopy: 2015  Obstetric History OB History  Gravida Para Term Preterm AB Living  0 0 0 0 0 0  SAB TAB Ectopic Multiple Live Births  0 0 0 0 0     ROS: A ROS was performed and pertinent positives and negatives are included in the history.  GENERAL: No fevers or chills. HEENT: No change in vision, no earache, sore throat or sinus congestion. NECK: No pain or stiffness. CARDIOVASCULAR: No chest pain or pressure. No palpitations. PULMONARY: No shortness of breath, cough or wheeze. GASTROINTESTINAL: No abdominal pain, nausea, vomiting or diarrhea, melena or bright red blood per rectum. GENITOURINARY: No urinary frequency, urgency, hesitancy or dysuria. MUSCULOSKELETAL: No joint or muscle pain, no back pain, no recent trauma. DERMATOLOGIC: No rash, no itching, no lesions. ENDOCRINE: No polyuria, polydipsia, no heat or cold intolerance. No recent change in weight. HEMATOLOGICAL: No anemia or easy bruising or bleeding. NEUROLOGIC: No  headache, seizures, numbness, tingling or weakness. PSYCHIATRIC: No depression, no loss of interest in normal activity or change in sleep pattern.     Exam:   BP 136/88   Ht '5\' 3"'$  (1.6 m)   Wt 194 lb (88 kg)   BMI 34.37 kg/m   Body mass index is 34.37 kg/m.  General appearance : Well developed well nourished female. No acute distress HEENT: Eyes: no retinal hemorrhage or exudates,  Neck supple, trachea midline, no carotid bruits, no thyroidmegaly Lungs: Clear to auscultation, no rhonchi or wheezes, or rib retractions  Heart: Regular rate and rhythm, no murmurs or gallops Breast:Examined in sitting and supine position were symmetrical in appearance, no palpable masses or tenderness,  no skin retraction, no nipple inversion, no nipple discharge, no skin discoloration, no axillary or supraclavicular lymphadenopathy Abdomen: no palpable masses or tenderness, no rebound or guarding Extremities: no edema or skin discoloration or tenderness  Pelvic: Vulva: Normal             Vagina: No gross lesions or discharge  Cervix: No gross lesions or discharge.   Uterus  AV, mildly increased in volume, nodular, non-tender and mobile  Adnexa  Without masses or tenderness  Anus: Normal   Assessment/Plan:  51 y.o. female for annual exam   1. Well female exam with routine gynecological exam Pelvic exam stable with asymptomatic small fibroids.  Pap test negative in May 2018.  Will repeat every 3 years.  Breast exam normal.  Screening mammogram scheduled today.  Fasting health labs here today.  Colonoscopy negative in 2015. - CBC - Comp Met (CMET) - Lipid  panel - TSH - VITAMIN D 25 Hydroxy (Vit-D Deficiency, Fractures)  2. Encounter for surveillance of contraceptive pills No symptom of menopause.  Needs contraception.  No contraindication to the birth control pill.  Doing well on Janel FE 1/20.  Same birth control pill represcribed.  Patient will follow-up at the end of the hormone free pills to  check an [facility]. - [place]; Future  3. Class 1 obesity due to excess calories without serious comorbidity with body mass index (BMI) of 34.0 to 34.9 in adult Body mass index at 34.37.  Recommend a low calorie/low carb diet such as [employer].  Increase physical activity with aerobic activities 5 times a week and weightlifting every 2 days.  Other orders - norethindrone-ethinyl estradiol (JUNEL FE,GILDESS FE,LOESTRIN FE) 1-20 MG-MCG tablet; Take 1 tablet by mouth daily.  Princess Bruins MD, 11:40 AM 12/31/2017

## 2018-01-01 LAB — LIPID PANEL
CHOLESTEROL: 159 mg/dL (ref <200)
HDL: 62 mg/dL (ref >50)
LDL Cholesterol (Calc): 81 mg/dL (calc)
NON-HDL CHOLESTEROL (CALC): 97 mg/dL (ref <130)
TRIGLYCERIDES: 77 mg/dL (ref <150)
Total CHOL/HDL Ratio: 2.6 (calc) (ref <5.0)

## 2018-01-01 LAB — COMPREHENSIVE METABOLIC PANEL
AG Ratio: 1.4 (calc) (ref 1.0–2.5)
ALKALINE PHOSPHATASE (APISO): 71 U/L (ref 33–130)
ALT: 11 U/L (ref 6–29)
AST: 13 U/L (ref 10–35)
Albumin: 4.3 g/dL (ref 3.6–5.1)
BILIRUBIN TOTAL: 0.5 mg/dL (ref 0.2–1.2)
BUN/Creatinine Ratio: 15 (calc) (ref 6–22)
BUN: 16 mg/dL (ref 7–25)
CALCIUM: 9.4 mg/dL (ref 8.6–10.4)
CHLORIDE: 104 mmol/L (ref 98–110)
CO2: 24 mmol/L (ref 20–32)
Creat: 1.07 mg/dL — ABNORMAL HIGH (ref 0.50–1.05)
GLOBULIN: 3 g/dL (ref 1.9–3.7)
Glucose, Bld: 85 mg/dL (ref 65–99)
Potassium: 4.3 mmol/L (ref 3.5–5.3)
Sodium: 138 mmol/L (ref 135–146)
Total Protein: 7.3 g/dL (ref 6.1–8.1)

## 2018-01-01 LAB — CBC
HCT: 38.8 % (ref 35.0–45.0)
Hemoglobin: 12.4 g/dL (ref 11.7–15.5)
MCH: 28.2 pg (ref 27.0–33.0)
MCHC: 32 g/dL (ref 32.0–36.0)
MCV: 88.2 fL (ref 80.0–100.0)
MPV: 10.4 fL (ref 7.5–12.5)
Platelets: 367 10*3/uL (ref 140–400)
RBC: 4.4 10*6/uL (ref 3.80–5.10)
RDW: 12.6 % (ref 11.0–15.0)
WBC: 5.5 10*3/uL (ref 3.8–10.8)

## 2018-01-01 LAB — TSH: TSH: 1.55 m[IU]/L

## 2018-01-01 LAB — VITAMIN D 25 HYDROXY (VIT D DEFICIENCY, FRACTURES): Vit D, 25-Hydroxy: 26 ng/mL — ABNORMAL LOW (ref 30–100)

## 2018-01-14 ENCOUNTER — Encounter: Payer: Self-pay | Admitting: Family Medicine

## 2018-01-14 ENCOUNTER — Ambulatory Visit: Payer: 59 | Admitting: Family Medicine

## 2018-01-14 VITALS — BP 132/88 | HR 63 | Temp 97.9°F | Ht 63.0 in | Wt 195.0 lb

## 2018-01-14 DIAGNOSIS — R7989 Other specified abnormal findings of blood chemistry: Secondary | ICD-10-CM | POA: Diagnosis not present

## 2018-01-14 DIAGNOSIS — H00011 Hordeolum externum right upper eyelid: Secondary | ICD-10-CM | POA: Diagnosis not present

## 2018-01-14 DIAGNOSIS — E559 Vitamin D deficiency, unspecified: Secondary | ICD-10-CM | POA: Insufficient documentation

## 2018-01-14 LAB — BASIC METABOLIC PANEL
BUN / CREAT RATIO: 14 (ref 9–23)
BUN: 14 mg/dL (ref 6–24)
CO2: 21 mmol/L (ref 20–29)
Calcium: 8.9 mg/dL (ref 8.7–10.2)
Chloride: 105 mmol/L (ref 96–106)
Creatinine, Ser: 1 mg/dL (ref 0.57–1.00)
GFR calc Af Amer: 76 mL/min/{1.73_m2} (ref >59)
GFR, EST NON AFRICAN AMERICAN: 66 mL/min/{1.73_m2} (ref >59)
Glucose: 88 mg/dL (ref 65–99)
POTASSIUM: 4.4 mmol/L (ref 3.5–5.2)
SODIUM: 139 mmol/L (ref 134–144)

## 2018-01-14 NOTE — Progress Notes (Signed)
Subjective: CC: elevated serum creatinine, vitamin D insufficiency, eye concern PCP: Sharion Balloon, FNP PYK:DXIPJA Colleen Gonzalez is a 52 y.o. female presenting to clinic today for:  1.  Elevated serum creatinine Patient recently evaluated by her gynecologist at the end of August and had fasting labs performed.  She notes that she had an elevation in her serum creatinine.  Denies any NSAID use, recent illness, new medications.  She was fasting and notes that she had not drink anything that morning.  2.  Vitamin D insufficiency Patient reports compliance with OTC vitamin D3.  3.  Eye concern Patient reports itching in the medial aspect of the right upper eyelid that started about a week ago.  She notes that she has had some dry skin over this area.  She now has a sore spot within the lid that she would like to have evaluated.  She notes she contacted her ophthalmologist and was told that this was likely a stye.  She has been using warm compresses intermittently.  Denies any ocular drainage, change in vision or conjunctivitis.  ROS: Per HPI  No Known Allergies Past Medical History:  Diagnosis Date  . Acne   . Allergy   . Anemia   . Eczema     Current Outpatient Medications:  .  ferrous sulfate 325 (65 FE) MG tablet, Take 325 mg by mouth daily with breakfast., Disp: , Rfl:  .  ibuprofen (ADVIL,MOTRIN) 200 MG tablet, Take 200 mg by mouth every 6 (six) hours as needed., Disp: , Rfl:  .  norethindrone-ethinyl estradiol (JUNEL FE,GILDESS FE,LOESTRIN FE) 1-20 MG-MCG tablet, Take 1 tablet by mouth daily., Disp: 3 Package, Rfl: 4 Social History   Socioeconomic History  . Marital status: Single    Spouse name: Not on file  . Number of children: 0  . Years of education: 73  . Highest education level: Not on file  Occupational History  . Occupation: operator(module)    Employer: [employer]  Social Needs  . Financial resource strain: Not on file  . Food insecurity:    Worry:  Not on file    Inability: Not on file  . Transportation needs:    Medical: Not on file    Non-medical: Not on file  Tobacco Use  . Smoking status: Never Smoker  . Smokeless tobacco: Never Used  Substance and Sexual Activity  . Alcohol use: Yes    Alcohol/week: 0.0 standard drinks    Comment: OCC  . Drug use: No  . Sexual activity: Yes    Partners: Male    Birth control/protection: Pill    Comment: 1st intercourse 13 yo-5 partners  Lifestyle  . Physical activity:    Days per week: Not on file    Minutes per session: Not on file  . Stress: Not on file  Relationships  . Social connections:    Talks on phone: Not on file    Gets together: Not on file    Attends religious service: Not on file    Active member of club or organization: Not on file    Attends meetings of clubs or organizations: Not on file    Relationship status: Not on file  . Intimate partner violence:    Fear of current or ex partner: Not on file    Emotionally abused: Not on file    Physically abused: Not on file    Forced sexual activity: Not on file  Other Topics Concern  . Not on file  Social History Narrative  . Not on file   Family History  Problem Relation Age of Onset  . Heart disease Mother        ANGINA; MI @ 40  . Skin cancer Mother   . Other Mother        transverse myelitis  . Diabetes Father   . Hypertension Father   . Stroke Father   . Breast cancer Paternal Aunt 17  . Hypertension Maternal Grandmother   . Uterine cancer Maternal Grandmother   . Stroke Maternal Grandfather     Objective: Office vital signs reviewed. BP 132/88   Pulse 63   Temp 97.9 F (36.6 C) (Oral)   Ht 5\' 3"  (1.6 m)   Wt 195 lb (88.5 kg)   BMI 34.54 kg/m   Physical Examination:  General: Awake, alert, well nourished, well appearing. No acute distress HEENT: Normal    Eyes: PERRLA, extraocular membranes intact, sclera white; she has a small, less than 1 mm hordeolum noted on the right upper eyelid.   She has associated dry skin.  Assessment/ Plan: 52 y.o. female   1. Elevated serum creatinine Was slightly elevated at 1.07.  No obvious etiology except for possible mild dehydration.  Will recheck today.  If persistently abnormal, plan for urinalysis to check for urine protein.  May need to initiate ACE inhibitor or ARB given technically stage I hypertension on exam today. - Basic Metabolic Panel  2. Hordeolum externum of right upper eyelid No evidence of secondary infection.  Recommended supportive care.  Handout provided.  Follow-up PRN.  3. Vitamin D insufficiency Currently being treated with OTC vitamin D.  We discussed that she can have this rechecked in about 12 weeks.   Janora Norlander, DO [place] [phone]

## 2018-01-14 NOTE — Patient Instructions (Addendum)
You had labs performed today.  You will be contacted with the results of the labs once they are available, usually in the next 3 business days for routine lab work.   Stye A stye is a bump on your eyelid caused by a bacterial infection. A stye can form inside the eyelid (internal stye) or outside the eyelid (external stye). An internal stye may be caused by an infected oil-producing gland inside your eyelid. An external stye may be caused by an infection at the base of your eyelash (hair follicle). Styes are very common. Anyone can get them at any age. They usually occur in just one eye, but you may have more than one in either eye. What are the causes? The infection is almost always caused by bacteria called Staphylococcus aureus. This is a common type of bacteria that lives on your skin. What increases the risk? You may be at higher risk for a stye if you have had one before. You may also be at higher risk if you have:  Diabetes.  Long-term illness.  Long-term eye redness.  A skin condition called seborrhea.  High fat levels in your blood (lipids).  What are the signs or symptoms? Eyelid pain is the most common symptom of a stye. Internal styes are more painful than external styes. Other signs and symptoms may include:  Painful swelling of your eyelid.  A scratchy feeling in your eye.  Tearing and redness of your eye.  Pus draining from the stye.  How is this diagnosed? Your health care provider may be able to diagnose a stye just by examining your eye. The health care provider may also check to make sure:  You do not have a fever or other signs of a more serious infection.  The infection has not spread to other parts of your eye or areas around your eye.  How is this treated? Most styes will clear up in a few days without treatment. In some cases, you may need to use antibiotic drops or ointment to prevent infection. Your health care provider may have to drain the stye  surgically if your stye is:  Large.  Causing a lot of pain.  Interfering with your vision.  This can be done using a thin blade or a needle. Follow these instructions at home:  Take medicines only as directed by your health care provider.  Apply a clean, warm compress to your eye for 10 minutes, 4 times a day.  Do not wear contact lenses or eye makeup until your stye has healed.  Do not try to pop or drain the stye. Contact a health care provider if:  You have chills or a fever.  Your stye does not go away after several days.  Your stye affects your vision.  Your eyeball becomes swollen, red, or painful. This information is not intended to replace advice given to you by your health care provider. Make sure you discuss any questions you have with your health care provider. Document Released: 02/04/2005 Document Revised: 12/22/2015 Document Reviewed: 08/11/2013 Elsevier Interactive Patient Education  [employer].

## 2018-05-07 ENCOUNTER — Encounter: Payer: Self-pay | Admitting: Pediatrics

## 2018-05-07 ENCOUNTER — Ambulatory Visit: Payer: 59 | Admitting: Pediatrics

## 2018-05-07 VITALS — BP 110/76 | HR 75 | Temp 97.3°F | Ht 63.0 in | Wt 202.0 lb

## 2018-05-07 DIAGNOSIS — L309 Dermatitis, unspecified: Secondary | ICD-10-CM | POA: Diagnosis not present

## 2018-05-07 DIAGNOSIS — L509 Urticaria, unspecified: Secondary | ICD-10-CM

## 2018-05-07 MED ORDER — TRIAMCINOLONE ACETONIDE 0.025 % EX OINT: 1.0000 "application " | TOPICAL_OINTMENT | Freq: Two times a day (BID) | CUTANEOUS | 1 refills | Status: DC

## 2018-05-07 MED ORDER — CETIRIZINE HCL 10 MG PO TABS: 10.0000 mg | ORAL_TABLET | Freq: Every day | ORAL | 11 refills | Status: DC

## 2018-05-07 NOTE — Progress Notes (Signed)
  Subjective:   Patient ID: Colleen Gonzalez, female    DOB: Mar 27, 1966, 52 y.o.   MRN: [id number] CC: Rash on arms and face  HPI: Colleen Gonzalez is a 52 y.o. female   About a month ago started having red rash that comes and goes on her arms, sometimes face.  Has history of eczema, this was different.  It would last for a few days, be very itchy, then go away often leaving hyperpigmented patches.  Red bumps would range from a couple centimeters to less than 1 cm in size.  Over the last week she has been in [place].  New 5 mm red raised plaque showed up this morning on her left upper arm.  There were mosquitoes in [place].  The rash is going on here before she left.  Has not regularly had hives before this.  She has been using hydrocortisone on the bumps when they are itchy.  It helps some.  She says she has been scratching at them a fair amount.  No shortness of breath or trouble breathing.  No itching or swelling in her tongue or lips.  No trouble swallowing.  Appetite has been fine.  No nausea or vomiting.  Face had similar rash but did not leave any marks after its resolution several days ago.  Relevant past medical, surgical, family and social history reviewed. Allergies and medications reviewed and updated. Social History   Tobacco Use  Smoking Status Never Smoker  Smokeless Tobacco Never Used   ROS: Per HPI   Objective:    BP 110/76   Pulse 75   Temp (!) 97.3 F (36.3 C) (Oral)   Ht 5\' 3"  (1.6 m)   Wt 202 lb (91.6 kg)   BMI 35.78 kg/m   Wt Readings from Last 3 Encounters:  05/07/18 202 lb (91.6 kg)  01/14/18 195 lb (88.5 kg)  12/31/17 194 lb (88 kg)    Gen: NAD, alert, cooperative with exam, NCAT EYES: EOMI, no conjunctival injection, or no icterus CV: NRRR, normal S1/S2, no murmur, distal pulses 2+ b/l Resp: CTABL, no wheezes, normal WOB Abd: +BS, soft, NTND. Neuro: Alert and oriented, strength equal b/l UE and LE, coordination grossly normal Skin: Approximately 5 mm  pink plaque left upper arm.  Right forearm with few scattered smooth hyperpigmented patches all less than 1 cm.  Left upper shoulder with 2 cm x 1 cm hyperpigmented patch  Assessment & Plan:  Colleen Gonzalez was seen today for rash on arms and face.  Diagnoses and all orders for this visit:  Dermatitis Avoid new products with fragrances, use hydrocortisone first, if still itching, okay to use below.  Discussed good skin care, thick moisturizer twice daily. -     triamcinolone (KENALOG) 0.025 % ointment; Apply 1 application topically 2 (two) times daily.  Urticaria Not clear if urticaria versus bug bites given recent travel..  Will do trial below for the next 2 to 4 weeks.  Let me know if any worsening in symptoms or if symptoms return. -     cetirizine (ZYRTEC) 10 MG tablet; Take 1 tablet (10 mg total) by mouth daily.   Follow up plan: Return if symptoms worsen or fail to improve. Assunta Found, MD [place]

## 2018-05-07 NOTE — Patient Instructions (Signed)
Skin Care in Eczema and/or Dry, Itchy skin Bathing Take brief (less than 5 minutes) showers or baths using cool or lukewarm water (not hot).  Bathe every other day at most.  Avoid the following soaps: Dial, [place], [place] spring, fragranced soaps.  Use non-fragranced Dove wash and apply only to areas that need it (armpits, groin, feet).  Pat you skin dry (do not rub) leaving a thin film of water on your skin.  Apply moisturizers within 5 minutes of bathing.   Moisturizers Apply a moisturizer to your skin at least once a day (even if you do not bathe).  Cool moisturizers on your skin help with itching (to accomplish this, place your moisturizers and medicated creams in the refrigerator).  In general, people with dry skin need a moisturizer that is scooped and not squirted.  Ointments (petrolatum ointment): greasy, but are the best moisturizers  Vaseline, Aquaphor, Crisco  Creams (thick, white cream that comes in a jar and is scooped with your hand)  Cerave, Cetaphil, Eucerin, Vanicream  Lotions (comes in a pump) is the weakest moisturizer but is a good choice for the face  Cetaphil, Cerave, Curel, Neutrogena, Lubriderm, Aveeno  Laundry soap Dye-free, fragrance-free laundry products are recommended.  Avoid wool clothing.  Do not scratch Scratching makes the skin even more itchy and leads to the "Itch-Scratch Cycle".  Here are some ways to get relief from itching WITHOUT scratching:  Fraser Din the areas  Cut your fingernails short  Cool your moisturizer in the refrigerator (cold decreases itching)  Put a bandage over an itchy area to prevent scratching  Find other activities to distract you like games, stress balls or a creative project

## 2018-05-09 ENCOUNTER — Other Ambulatory Visit: Payer: 59

## 2018-05-09 ENCOUNTER — Other Ambulatory Visit: Payer: Self-pay | Admitting: Pediatrics

## 2018-05-09 DIAGNOSIS — Z3041 Encounter for surveillance of contraceptive pills: Secondary | ICD-10-CM

## 2018-05-09 DIAGNOSIS — R21 Rash and other nonspecific skin eruption: Secondary | ICD-10-CM

## 2018-05-10 LAB — CMP14+EGFR
A/G RATIO: 1.7 (ref 1.2–2.2)
ALBUMIN: 3.9 g/dL (ref 3.5–5.5)
ALT: 25 IU/L (ref 0–32)
AST: 18 IU/L (ref 0–40)
Alkaline Phosphatase: 73 IU/L (ref 39–117)
BUN / CREAT RATIO: 15 (ref 9–23)
BUN: 15 mg/dL (ref 6–24)
Bilirubin Total: 0.3 mg/dL (ref 0.0–1.2)
CO2: 21 mmol/L (ref 20–29)
Calcium: 8.8 mg/dL (ref 8.7–10.2)
Chloride: 108 mmol/L — ABNORMAL HIGH (ref 96–106)
Creatinine, Ser: 1.02 mg/dL — ABNORMAL HIGH (ref 0.57–1.00)
GFR, EST AFRICAN AMERICAN: 74 mL/min/{1.73_m2} (ref >59)
GFR, EST NON AFRICAN AMERICAN: 64 mL/min/{1.73_m2} (ref >59)
GLOBULIN, TOTAL: 2.3 g/dL (ref 1.5–4.5)
Glucose: 92 mg/dL (ref 65–99)
POTASSIUM: 4.7 mmol/L (ref 3.5–5.2)
SODIUM: 141 mmol/L (ref 134–144)
TOTAL PROTEIN: 6.2 g/dL (ref 6.0–8.5)

## 2018-05-10 LAB — FOLLICLE STIMULATING HORMONE: FSH: 6.1 m[IU]/mL

## 2018-05-10 LAB — CBC WITH DIFFERENTIAL/PLATELET
BASOS: 0 %
Basophils Absolute: 0 10*3/uL (ref 0.0–0.2)
EOS (ABSOLUTE): 0.6 10*3/uL — ABNORMAL HIGH (ref 0.0–0.4)
Eos: 12 %
HEMATOCRIT: 35.3 % (ref 34.0–46.6)
Hemoglobin: 11.5 g/dL (ref 11.1–15.9)
Immature Grans (Abs): 0 10*3/uL (ref 0.0–0.1)
Immature Granulocytes: 0 %
Lymphocytes Absolute: 2.1 10*3/uL (ref 0.7–3.1)
Lymphs: 44 %
MCH: 28 pg (ref 26.6–33.0)
MCHC: 32.6 g/dL (ref 31.5–35.7)
MCV: 86 fL (ref 79–97)
MONOS ABS: 0.4 10*3/uL (ref 0.1–0.9)
Monocytes: 7 %
NEUTROS ABS: 1.8 10*3/uL (ref 1.4–7.0)
Neutrophils: 37 %
Platelets: 392 10*3/uL (ref 150–450)
RBC: 4.1 x10E6/uL (ref 3.77–5.28)
RDW: 13 % (ref 12.3–15.4)
WBC: 4.8 10*3/uL (ref 3.4–10.8)

## 2018-11-30 ENCOUNTER — Other Ambulatory Visit: Payer: Self-pay | Admitting: Obstetrics & Gynecology

## 2018-11-30 DIAGNOSIS — Z1231 Encounter for screening mammogram for malignant neoplasm of breast: Secondary | ICD-10-CM

## 2018-12-16 ENCOUNTER — Other Ambulatory Visit: Payer: Self-pay

## 2018-12-16 ENCOUNTER — Ambulatory Visit (INDEPENDENT_AMBULATORY_CARE_PROVIDER_SITE_OTHER): Payer: 59

## 2018-12-16 ENCOUNTER — Encounter: Payer: Self-pay | Admitting: Podiatry

## 2018-12-16 ENCOUNTER — Ambulatory Visit: Payer: 59 | Admitting: Podiatry

## 2018-12-16 DIAGNOSIS — M722 Plantar fascial fibromatosis: Secondary | ICD-10-CM

## 2018-12-16 MED ORDER — DICLOFENAC SODIUM 75 MG PO TBEC: 75.0000 mg | DELAYED_RELEASE_TABLET | Freq: Two times a day (BID) | ORAL | 2 refills | Status: DC

## 2018-12-16 NOTE — Progress Notes (Signed)
   Subjective:    Patient ID: Colleen Gonzalez, female    DOB: [birth year], 53 y.o.   MRN: [id number]  HPI    Review of Systems  All other systems reviewed and are negative.      Objective:   Physical Exam        Assessment & Plan:

## 2018-12-16 NOTE — Patient Instructions (Signed)

## 2018-12-20 NOTE — Progress Notes (Signed)
Subjective:   Patient ID: Colleen Gonzalez, female   DOB: 53 y.o.   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  States is been gradually giving her more trouble since then and she has tried different modalities currently without relief of symptoms.  Patient does not smoke likes to be active   Review of Systems  All other systems reviewed and are negative.       Objective:  Physical Exam Vitals signs and nursing note reviewed.  Constitutional:      Appearance: She is well-developed.  Pulmonary:     Effort: Pulmonary effort is normal.  Musculoskeletal: Normal range of motion.  Skin:    General: Skin is warm.  Neurological:     Mental Status: She is alert.     Neurovascular status intact muscle strength found to be adequate range of motion within normal limits with patient found to have quite a bit of discomfort in the medial band of the fascia bilateral with fluid buildup around the medial band and moderate flattening of the arch noted bilateral.  Patient is found to have good digital perfusion well oriented x3 with mild forefoot pain     Assessment:  Acute plantar fasciitis bilateral with cement floors contributing to factor along with flatfoot deformity and mild forefoot capsulitis     Plan:  H&P discussed both conditions and today I injected the fascia bilateral 3 mg Kenalog 5 mg Xylocaine and applied fascial brace bilateral to lift the arches and discussed long-term [place] type orthotics and patient will be seen back again in the next several weeks to reevaluate see response and decide on long-term treatment.  Patient is encouraged to call with any questions  X-rays indicate small spur no indication stress fracture arthritis at the current time

## 2019-01-02 ENCOUNTER — Ambulatory Visit (INDEPENDENT_AMBULATORY_CARE_PROVIDER_SITE_OTHER): Payer: 59 | Admitting: Podiatry

## 2019-01-02 ENCOUNTER — Encounter: Payer: Self-pay | Admitting: Podiatry

## 2019-01-02 ENCOUNTER — Other Ambulatory Visit: Payer: Self-pay

## 2019-01-02 DIAGNOSIS — M722 Plantar fascial fibromatosis: Secondary | ICD-10-CM

## 2019-01-02 NOTE — Progress Notes (Signed)
Subjective:   Patient ID: Colleen Gonzalez, female   DOB: 53 y.o.   MRN: [id number]   HPI Patient states the right is improving but she knows that she has flatfeet and she is going to need something to lift her arches.  States that the mid arch on the left one has been bothersome and she is very pleased so far with treatment on the right as far as recovery   ROS      Objective:  Physical Exam  Neurovascular status intact with patient's left mid arch area being inflamed and moderately tender with the right doing much better with flatfoot deformity noted     Assessment:  Mid arch fasciitis left foot still tender when palpated with patient noted to have improvement right and flatfoot deformity     Plan:  H&P reviewed condition and for the left I did sterile prep and did a careful mid arch injection 3 mg Kenalog 5 mg Xylocaine and then went ahead and casted for [facility] orthotics to provide for complete lift in the arch with deep heel seat of 15 mm.  Patient will be seen back to recheck when orthotics returned

## 2019-01-04 ENCOUNTER — Encounter: Payer: Self-pay | Admitting: Obstetrics & Gynecology

## 2019-01-04 ENCOUNTER — Ambulatory Visit: Payer: 59 | Admitting: Obstetrics & Gynecology

## 2019-01-04 ENCOUNTER — Other Ambulatory Visit: Payer: Self-pay

## 2019-01-04 VITALS — BP 150/90 | Ht 63.0 in | Wt 203.0 lb

## 2019-01-04 DIAGNOSIS — Z30011 Encounter for initial prescription of contraceptive pills: Secondary | ICD-10-CM | POA: Diagnosis not present

## 2019-01-04 DIAGNOSIS — Z6835 Body mass index (BMI) 35.0-35.9, adult: Secondary | ICD-10-CM

## 2019-01-04 DIAGNOSIS — E6609 Other obesity due to excess calories: Secondary | ICD-10-CM

## 2019-01-04 DIAGNOSIS — N951 Menopausal and female climacteric states: Secondary | ICD-10-CM | POA: Diagnosis not present

## 2019-01-04 DIAGNOSIS — Z01419 Encounter for gynecological examination (general) (routine) without abnormal findings: Secondary | ICD-10-CM | POA: Diagnosis not present

## 2019-01-04 MED ORDER — NORETHINDRONE 0.35 MG PO TABS: 1.0000 | ORAL_TABLET | Freq: Every day | ORAL | 4 refills | Status: DC

## 2019-01-04 NOTE — Progress Notes (Signed)
Colleen Gonzalez [birth year] [id number]   History:    53 y.o. G0 Stable boyfriend  RP:  Established patient presenting for annual gyn exam   HPI: Stopped the BCPs 3 months ago because had mild facial acne and thought she was menopausal...  Had a menstrual period 12/12/2018.  IC x 1 since then 3 days ago.  No pelvic pain.  Normal vaginal secretions.  Urine/BMs normal.  Breasts normal.  BMI 35.96. Needs to increase physical activity and reduce calories.  Health labs here today.  Past medical history,surgical history, family history and social history were all reviewed and documented in the EPIC chart.  Gynecologic History Patient's last menstrual period was 12/21/2018. Contraception: none (Stopped BCPs 3 months ago) Last Pap: 09/2016. Results were: Negative Last mammogram: 12/2017. Results were: Negative Bone Density: Never Colonoscopy: 2015  Obstetric History OB History  Gravida Para Term Preterm AB Living  0 0 0 0 0 0  SAB TAB Ectopic Multiple Live Births  0 0 0 0 0     ROS: A ROS was performed and pertinent positives and negatives are included in the history.  GENERAL: No fevers or chills. HEENT: No change in vision, no earache, sore throat or sinus congestion. NECK: No pain or stiffness. CARDIOVASCULAR: No chest pain or pressure. No palpitations. PULMONARY: No shortness of breath, cough or wheeze. GASTROINTESTINAL: No abdominal pain, nausea, vomiting or diarrhea, melena or bright red blood per rectum. GENITOURINARY: No urinary frequency, urgency, hesitancy or dysuria. MUSCULOSKELETAL: No joint or muscle pain, no back pain, no recent trauma. DERMATOLOGIC: No rash, no itching, no lesions. ENDOCRINE: No polyuria, polydipsia, no heat or cold intolerance. No recent change in weight. HEMATOLOGICAL: No anemia or easy bruising or bleeding. NEUROLOGIC: No headache, seizures, numbness, tingling or weakness. PSYCHIATRIC: No depression, no loss of interest in normal activity or change in sleep  pattern.     Exam:   BP (!) 150/90   Ht _0  (1.6 m)   Wt 203 lb (92.1 kg)   LMP 12/21/2018   BMI 35.96 kg/m   Body mass index is 35.96 kg/m.  General appearance : Well developed well nourished female. No acute distress HEENT: Eyes: no retinal hemorrhage or exudates,  Neck supple, trachea midline, no carotid bruits, no thyroidmegaly Lungs: Clear to auscultation, no rhonchi or wheezes, or rib retractions  Heart: Regular rate and rhythm, no murmurs or gallops Breast:Examined in sitting and supine position were symmetrical in appearance, no palpable masses or tenderness,  no skin retraction, no nipple inversion, no nipple discharge, no skin discoloration, no axillary or supraclavicular lymphadenopathy Abdomen: no palpable masses or tenderness, no rebound or guarding Extremities: no edema or skin discoloration or tenderness  Pelvic: Vulva: Normal             Vagina: No gross lesions or discharge  Cervix: No gross lesions or discharge.  Pap reflex done.  Uterus  AV, normal size, shape and consistency, non-tender and mobile  Adnexa  Without masses or tenderness  Anus: Normal   Assessment/Plan:  53 y.o. female for annual exam   1. Encounter for routine gynecological examination with Papanicolaou smear of cervix Normal gynecologic exam.  Pap reflex done.  Breast exam normal.  Screening mammogram schedule in September 2020.  Fasting health labs here today.  Colonoscopy 2015. - CBC - Comp Met (CMET) - TSH - VITAMIN D 25 Hydroxy (Vit-D Deficiency, Fractures) - Lipid panel  2. Perimenopause Perimenopause with one menstrual.  Since stopped birth control  pills 3 months ago.  Very mild vasomotor symptoms.  Decision to start on the progestin only birth control pill both for contraception and protection of the endometrium.  3. Encounter for initial prescription of contraceptive pills Probably perimenopausal.  Needs contraception.  Decision to start on the progestin only birth control  pill.  No contraindication.  Norethindrone 0.35 mg/tab. 1 tablet daily prescribed.  Usage reviewed and prescription sent to pharmacy.  4. Class 2 obesity due to excess calories without serious comorbidity with body mass index (BMI) of 35.0 to 35.9 in adult  Other orders - acetaminophen (TYLENOL) 500 MG tablet; Take 500 mg by mouth every 6 (six) hours as needed. - norethindrone (MICRONOR) 0.35 MG tablet; Take 1 tablet (0.35 mg total) by mouth daily.  Princess Bruins MD, 10:17 AM 01/04/2019

## 2019-01-04 NOTE — Addendum Note (Signed)
Addended by: Thurnell Garbe A on: 01/04/2019 04:17 PM   Modules accepted: Orders

## 2019-01-04 NOTE — Patient Instructions (Signed)
1. Encounter for routine gynecological examination with Papanicolaou smear of cervix Normal gynecologic exam.  Pap reflex done.  Breast exam normal.  Screening mammogram schedule in September 2020.  Fasting health labs here today.  Colonoscopy 2015. - CBC - Comp Met (CMET) - TSH - VITAMIN D 25 Hydroxy (Vit-D Deficiency, Fractures) - Lipid panel  2. Perimenopause Perimenopause with one menstrual.  Since stopped birth control pills 3 months ago.  Very mild vasomotor symptoms.  Decision to start on the progestin only birth control pill both for contraception and protection of the endometrium.  3. Encounter for initial prescription of contraceptive pills Probably perimenopausal.  Needs contraception.  Decision to start on the progestin only birth control pill.  No contraindication.  Norethindrone 0.35 mg/tab. 1 tablet daily prescribed.  Usage reviewed and prescription sent to pharmacy.  4. Class 2 obesity due to excess calories without serious comorbidity with body mass index (BMI) of 35.0 to 35.9 in adult  Other orders - acetaminophen (TYLENOL) 500 MG tablet; Take 500 mg by mouth every 6 (six) hours as needed. - norethindrone (MICRONOR) 0.35 MG tablet; Take 1 tablet (0.35 mg total) by mouth daily.  Colleen Gonzalez, it was a pleasure seeing you today!  I will inform you of your results as soon as they are available.

## 2019-01-05 LAB — CBC
HCT: 32.8 % — ABNORMAL LOW (ref 35.0–45.0)
Hemoglobin: 10.4 g/dL — ABNORMAL LOW (ref 11.7–15.5)
MCH: 27.4 pg (ref 27.0–33.0)
MCHC: 31.7 g/dL — ABNORMAL LOW (ref 32.0–36.0)
MCV: 86.5 fL (ref 80.0–100.0)
MPV: 10.4 fL (ref 7.5–12.5)
Platelets: 295 10*3/uL (ref 140–400)
RBC: 3.79 10*6/uL — ABNORMAL LOW (ref 3.80–5.10)
RDW: 13.8 % (ref 11.0–15.0)
WBC: 5.9 10*3/uL (ref 3.8–10.8)

## 2019-01-05 LAB — LIPID PANEL
Cholesterol: 160 mg/dL (ref <200)
HDL: 58 mg/dL (ref >=50)
LDL Cholesterol (Calc): 86 mg/dL (calc)
Non-HDL Cholesterol (Calc): 102 mg/dL (calc) (ref <130)
Total CHOL/HDL Ratio: 2.8 (calc) (ref <5.0)
Triglycerides: 72 mg/dL (ref <150)

## 2019-01-05 LAB — COMPREHENSIVE METABOLIC PANEL
AG Ratio: 1.7 (calc) (ref 1.0–2.5)
ALT: 56 U/L — ABNORMAL HIGH (ref 6–29)
AST: 35 U/L (ref 10–35)
Albumin: 4 g/dL (ref 3.6–5.1)
Alkaline phosphatase (APISO): 94 U/L (ref 37–153)
BUN: 19 mg/dL (ref 7–25)
CO2: 24 mmol/L (ref 20–32)
Calcium: 8.9 mg/dL (ref 8.6–10.4)
Chloride: 105 mmol/L (ref 98–110)
Creat: 1 mg/dL (ref 0.50–1.05)
Globulin: 2.4 g/dL (calc) (ref 1.9–3.7)
Glucose, Bld: 89 mg/dL (ref 65–99)
Potassium: 4.4 mmol/L (ref 3.5–5.3)
Sodium: 139 mmol/L (ref 135–146)
Total Bilirubin: 0.4 mg/dL (ref 0.2–1.2)
Total Protein: 6.4 g/dL (ref 6.1–8.1)

## 2019-01-05 LAB — PAP IG W/ RFLX HPV ASCU

## 2019-01-05 LAB — TSH: TSH: 3.63 mIU/L

## 2019-01-05 LAB — VITAMIN D 25 HYDROXY (VIT D DEFICIENCY, FRACTURES): Vit D, 25-Hydroxy: 77 ng/mL (ref 30–100)

## 2019-01-06 ENCOUNTER — Other Ambulatory Visit: Payer: Self-pay

## 2019-01-06 DIAGNOSIS — R7989 Other specified abnormal findings of blood chemistry: Secondary | ICD-10-CM

## 2019-01-12 ENCOUNTER — Telehealth: Payer: Self-pay

## 2019-01-12 NOTE — Telephone Encounter (Signed)
I called patient and left message that I sent [place] CHart  email on 01/06/19 but it returned unread today. I asked her to call me or either go into My Chart and read email and reply to let me know she read it.  "Dr. Dellis Filbert commented on your recent lab results. She wrote "Lipid profile is normal. TSH (thyroid) is normal.  Vitamin D level is high normal. You can decrease your Vitamin Dsupplement a little.   Comprehensive metabolic panel shows one of your liver functions, ALT, mildly increased to 56. Recheck this lab in 4 weeks.    Hemoglobin is low at 10.4. Take Ferrous Sulfate 325 mg twice a day.   Share results with Family M/Primary Care provider. "   I will place lab order on file to recheck your liver function test in 4 weeks. You will need to schedule a lab appointment and come well hydrated. "

## 2019-01-12 NOTE — Telephone Encounter (Signed)
Left message to call about unread email.  See telephone encounter.

## 2019-01-12 NOTE — Telephone Encounter (Signed)
Spoke with patient and read her the email. Appt scheduled 9/21 at 12:30pm to recheck lft's.

## 2019-01-13 ENCOUNTER — Ambulatory Visit: Payer: 59

## 2019-01-24 ENCOUNTER — Ambulatory Visit: Payer: 59 | Admitting: Orthotics

## 2019-01-24 ENCOUNTER — Other Ambulatory Visit: Payer: Self-pay

## 2019-01-24 DIAGNOSIS — M722 Plantar fascial fibromatosis: Secondary | ICD-10-CM

## 2019-01-24 NOTE — Progress Notes (Signed)
Patient came in today to pick up custom made foot orthotics.  The goals were accomplished and the patient reported no dissatisfaction with said orthotics.  Patient was advised of breakin period and how to report any issues. 

## 2019-01-30 ENCOUNTER — Other Ambulatory Visit: Payer: 59

## 2019-01-30 ENCOUNTER — Other Ambulatory Visit: Payer: Self-pay

## 2019-01-30 DIAGNOSIS — R7989 Other specified abnormal findings of blood chemistry: Secondary | ICD-10-CM

## 2019-01-31 LAB — ALT: ALT: 11 U/L (ref 6–29)

## 2019-01-31 LAB — AST: AST: 11 U/L (ref 10–35)

## 2019-02-24 ENCOUNTER — Other Ambulatory Visit: Payer: Self-pay

## 2019-02-24 ENCOUNTER — Ambulatory Visit
Admission: RE | Admit: 2019-02-24 | Discharge: 2019-02-24 | Disposition: A | Discharge status: Home / Self Care | Payer: 59 | Source: Ambulatory Visit | Attending: Obstetrics & Gynecology | Admitting: Obstetrics & Gynecology

## 2019-02-24 DIAGNOSIS — Z1231 Encounter for screening mammogram for malignant neoplasm of breast: Secondary | ICD-10-CM

## 2019-03-01 ENCOUNTER — Other Ambulatory Visit: Payer: Self-pay | Admitting: Podiatry

## 2019-04-13 ENCOUNTER — Telehealth: Payer: Self-pay | Admitting: Podiatry

## 2019-04-13 NOTE — Telephone Encounter (Signed)
Pt called to ask how long she needs to keep taking anti inflammatory pill she stated taking a month ago

## 2019-04-13 NOTE — Telephone Encounter (Signed)
Left message for pt to call again to discuss the medication.

## 2019-04-14 NOTE — Telephone Encounter (Signed)
I called pt on mobile phone and informed that if she was not longer having problematic symptoms she could stop the diclofenac if she has completed 50 doses without a problem. Pt states understanding.

## 2019-04-14 NOTE — Telephone Encounter (Signed)
Patient called back returning nurses message regarding her medication

## 2019-04-20 ENCOUNTER — Telehealth: Payer: Self-pay | Admitting: *Deleted

## 2019-04-20 NOTE — Telephone Encounter (Signed)
Pt called states she was seen in August and received an injection and orthotics, she states the orthotics are worn and may be part of the problem and she needed to see if she should come in.

## 2019-04-20 NOTE — Telephone Encounter (Signed)
I informed pt that treatment of plantar fasciitis was a treatment that was built on other treatments such as orthotics and injections, some cases one may work immediately for a pt or pt may need more than one injection and she should make an appt to continue the steps to healing the plantar fasciitis. Transferred pt to schedulers.

## 2019-04-24 DIAGNOSIS — S63651A Sprain of metacarpophalangeal joint of left index finger, initial encounter: Secondary | ICD-10-CM | POA: Insufficient documentation

## 2019-04-28 ENCOUNTER — Ambulatory Visit: Payer: 59 | Admitting: Podiatry

## 2019-05-25 ENCOUNTER — Encounter: Payer: Self-pay | Admitting: Podiatry

## 2019-05-25 ENCOUNTER — Ambulatory Visit: Payer: 59 | Admitting: Podiatry

## 2019-05-25 ENCOUNTER — Ambulatory Visit (INDEPENDENT_AMBULATORY_CARE_PROVIDER_SITE_OTHER): Payer: 59

## 2019-05-25 ENCOUNTER — Other Ambulatory Visit: Payer: Self-pay

## 2019-05-25 DIAGNOSIS — M722 Plantar fascial fibromatosis: Secondary | ICD-10-CM | POA: Diagnosis not present

## 2019-05-25 NOTE — Progress Notes (Signed)
Subjective:   Patient ID: Colleen Gonzalez, female   DOB: 54 y.o.   MRN: [id number]   HPI Patient presents stating that she is getting a lot of pain in her feet again and it seems that the foot is worse when she gets up in the morning and after sitting   ROS      Objective:  Physical Exam  Neurovascular status was found to be intact with quite a bit of inflammation of the heel right over left with pain in the heel and the extending into the arch.  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  Also instructed on ice and I did go ahead and casted for a orthotic to utilize for home as the one for work is a Retail banker and what we can do

## 2019-06-15 ENCOUNTER — Ambulatory Visit: Payer: 59 | Admitting: Orthotics

## 2019-06-15 ENCOUNTER — Other Ambulatory Visit: Payer: Self-pay

## 2019-06-15 DIAGNOSIS — M722 Plantar fascial fibromatosis: Secondary | ICD-10-CM

## 2019-06-15 NOTE — Progress Notes (Signed)
Patient came in today to pick up custom made foot orthotics.  The goals were accomplished and the patient reported no dissatisfaction with said orthotics.  Patient was advised of breakin period and how to report any issues. 

## 2019-07-18 ENCOUNTER — Telehealth: Payer: Self-pay | Admitting: *Deleted

## 2019-07-18 NOTE — Telephone Encounter (Signed)
Left message informing pt that treatment for plantar fasciitis heel pain was therapies that were built on the previous therapy and she would need to make an appt, continue the night splint and ice therapy.

## 2019-07-18 NOTE — Telephone Encounter (Signed)
Pt called states she is still having heel pain.

## 2019-08-03 ENCOUNTER — Ambulatory Visit: Payer: 59 | Admitting: Podiatry

## 2019-08-07 ENCOUNTER — Ambulatory Visit: Payer: 59 | Admitting: Podiatry

## 2019-08-07 ENCOUNTER — Other Ambulatory Visit: Payer: Self-pay

## 2019-08-07 ENCOUNTER — Encounter: Payer: Self-pay | Admitting: Podiatry

## 2019-08-07 DIAGNOSIS — M722 Plantar fascial fibromatosis: Secondary | ICD-10-CM

## 2019-08-07 MED ORDER — DICLOFENAC SODIUM 75 MG PO TBEC: 75.0000 mg | DELAYED_RELEASE_TABLET | Freq: Two times a day (BID) | ORAL | 2 refills | Status: DC

## 2019-08-07 NOTE — Progress Notes (Signed)
Subjective:   Patient ID: Colleen Gonzalez, female   DOB: 54 y.o.   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  Due to the fact that so intense I have applied air fracture walker today to completely immobilize and I discussed with her possibly having to take several weeks off of work but will try to have her continue working.  I did do sterile prep and reinjected the fascia 3 mg Kenalog 5 mg Xylocaine

## 2019-08-28 ENCOUNTER — Ambulatory Visit: Payer: 59 | Admitting: Podiatry

## 2019-08-30 ENCOUNTER — Other Ambulatory Visit: Payer: Self-pay

## 2019-08-30 ENCOUNTER — Ambulatory Visit: Payer: 59 | Admitting: Podiatry

## 2019-08-30 DIAGNOSIS — M722 Plantar fascial fibromatosis: Secondary | ICD-10-CM | POA: Diagnosis not present

## 2019-08-30 DIAGNOSIS — M21611 Bunion of right foot: Secondary | ICD-10-CM

## 2019-08-30 DIAGNOSIS — M21619 Bunion of unspecified foot: Secondary | ICD-10-CM

## 2019-08-30 NOTE — Progress Notes (Signed)
Subjective:   Patient ID: Colleen Gonzalez, female   DOB: 54 y.o.   MRN: [id number]   HPI Patient states the heel has started to feel better and also I do have the bunion on this foot   ROS      Objective:  Physical Exam  Neurovascular status intact with patient found to have significant reduction of discomfort in the heel right with inflammation only noted upon deep palpation with moderate bunion formation right     Assessment:  Improved fasciitis right with bunion formation right     Plan:  H&P reviewed condition recommended continued anti-inflammatories immobilization as needed and stretching exercises.  Reappoint to recheck

## 2019-10-08 NOTE — Progress Notes (Deleted)
Subjective: CC: f/u blood pressure PCP: Janora Norlander, DO [id number] Colleen Gonzalez is a 54 y.o. female presenting to clinic today for:  1. Blood pressure *** ***   ROS: Per HPI  No Known Allergies Past Medical History:  Diagnosis Date  . Acne   . Allergy   . Anemia   . Eczema     Current Outpatient Medications:  .  acetaminophen (TYLENOL) 500 MG tablet, Take 500 mg by mouth every 6 (six) hours as needed., Disp: , Rfl:  .  diclofenac (VOLTAREN) 75 MG EC tablet, Take 1 tablet (75 mg total) by mouth 2 (two) times daily., Disp: 50 tablet, Rfl: 2 .  diclofenac (VOLTAREN) 75 MG EC tablet, Take 1 tablet (75 mg total) by mouth 2 (two) times daily., Disp: 50 tablet, Rfl: 2 .  ferrous sulfate 325 (65 FE) MG tablet, Take 325 mg by mouth daily with breakfast., Disp: , Rfl:  .  norethindrone (MICRONOR) 0.35 MG tablet, Take 1 tablet (0.35 mg total) by mouth daily., Disp: 3 Package, Rfl: 4 .  tretinoin (RETIN-A) 0.05 % cream, APPLY A PEA SIZED AMOUNT ON THE FACE/SKIN NIGHTLY, Disp: , Rfl:  Social History   Socioeconomic History  . Marital status: Single    Spouse name: Not on file  . Number of children: 0  . Years of education: 68  . Highest education level: Not on file  Occupational History  . Occupation: operator(module)    Employer: [employer]  Tobacco Use  . Smoking status: Never Smoker  . Smokeless tobacco: Never Used  Substance and Sexual Activity  . Alcohol use: Yes    Alcohol/week: 0.0 standard drinks    Comment: OCC  . Drug use: No  . Sexual activity: Yes    Partners: Male    Birth control/protection: Pill    Comment: 1st intercourse 22 yo-5 partners  Other Topics Concern  . Not on file  Social History Narrative  . Not on file   Social Determinants of Health   Financial Resource Strain:   . Difficulty of Paying Living Expenses:   Food Insecurity:   . Worried About Charity fundraiser in the Last Year:   . Arboriculturist in the Last Year:     Transportation Needs:   . Film/video editor (Medical):   Marland Kitchen Lack of Transportation (Non-Medical):   Physical Activity:   . Days of Exercise per Week:   . Minutes of Exercise per Session:   Stress:   . Feeling of Stress :   Social Connections:   . Frequency of Communication with Friends and Family:   . Frequency of Social Gatherings with Friends and Family:   . Attends Religious Services:   . Active Member of Clubs or Organizations:   . Attends Archivist Meetings:   Marland Kitchen Marital Status:   Intimate Partner Violence:   . Fear of Current or Ex-Partner:   . Emotionally Abused:   Marland Kitchen Physically Abused:   . Sexually Abused:    Family History  Problem Relation Age of Onset  . Heart disease Mother        ANGINA; MI @ 51  . Skin cancer Mother   . Other Mother        transverse myelitis  . Diabetes Father   . Hypertension Father   . Stroke Father   . Breast cancer Paternal Aunt 24  . Hypertension Maternal Grandmother   . Uterine cancer Maternal Grandmother   .  Stroke Maternal Grandfather     Objective: Office vital signs reviewed. There were no vitals taken for this visit.  Physical Examination:  General: Awake, alert, *** nourished, No acute distress HEENT: Normal    Neck: No masses palpated. No lymphadenopathy    Ears: Tympanic membranes intact, normal light reflex, no erythema, no bulging    Eyes: PERRLA, extraocular membranes intact, sclera ***    Nose: nasal turbinates moist, *** nasal discharge    Throat: moist mucus membranes, no erythema, *** tonsillar exudate.  Airway is patent Cardio: regular rate and rhythm, S1S2 heard, no murmurs appreciated Pulm: clear to auscultation bilaterally, no wheezes, rhonchi or rales; normal work of breathing on room air GI: soft, non-tender, non-distended, bowel sounds present x4, no hepatomegaly, no splenomegaly, no masses GU: external vaginal tissue ***, cervix ***, *** punctate lesions on cervix appreciated, *** discharge  from cervical os, *** bleeding, *** cervical motion tenderness, *** abdominal/ adnexal masses Extremities: warm, well perfused, No edema, cyanosis or clubbing; +*** pulses bilaterally MSK: *** gait and *** station Skin: dry; intact; no rashes or lesions Neuro: *** Strength and light touch sensation grossly intact, *** DTRs ***/4  Assessment/ Plan: 54 y.o. female   ***  No orders of the defined types were placed in this encounter.  No orders of the defined types were placed in this encounter.    Janora Norlander, DO [place] [phone]

## 2019-10-10 ENCOUNTER — Ambulatory Visit: Payer: 59 | Admitting: Family Medicine

## 2019-11-21 ENCOUNTER — Other Ambulatory Visit: Payer: Self-pay

## 2019-11-21 ENCOUNTER — Encounter: Payer: Self-pay | Admitting: Family Medicine

## 2019-11-21 ENCOUNTER — Ambulatory Visit: Payer: 59 | Admitting: Family Medicine

## 2019-11-21 VITALS — BP 142/91 | HR 75 | Temp 97.6°F | Ht 63.0 in | Wt 191.0 lb

## 2019-11-21 DIAGNOSIS — Z130 Encounter for screening for diseases of the blood and blood-forming organs and certain disorders involving the immune mechanism: Secondary | ICD-10-CM

## 2019-11-21 DIAGNOSIS — I10 Essential (primary) hypertension: Secondary | ICD-10-CM | POA: Diagnosis not present

## 2019-11-21 DIAGNOSIS — E669 Obesity, unspecified: Secondary | ICD-10-CM

## 2019-11-21 DIAGNOSIS — E559 Vitamin D deficiency, unspecified: Secondary | ICD-10-CM

## 2019-11-21 HISTORY — DX: Essential (primary) hypertension: I10

## 2019-11-21 NOTE — Patient Instructions (Addendum)
You had labs performed today.  You will be contacted with the results of the labs once they are available, usually in the next 3 business days for routine lab work.  If you have an active my chart account, they will be released to your MyChart.  If you prefer to have these labs released to you via telephone, please let us know.  If you had a pap smear or biopsy performed, expect to be contacted in about 7-10 days.  See [place] back in 2 weeks for BP check with nurse.  DASH Eating Plan DASH stands for "Dietary Approaches to Stop Hypertension." The DASH eating plan is a healthy eating plan that has been shown to reduce high blood pressure (hypertension). It may also reduce your risk for type 2 diabetes, heart disease, and stroke. The DASH eating plan may also help with weight loss. What are tips for following this plan?  General guidelines  Avoid eating more than 2,300 mg (milligrams) of salt (sodium) a day. If you have hypertension, you may need to reduce your sodium intake to 1,500 mg a day.  Limit alcohol intake to no more than 1 drink a day for nonpregnant women and 2 drinks a day for men. One drink equals 12 oz of beer, 5 oz of wine, or 1 oz of hard liquor.  Work with your health care provider to maintain a healthy body weight or to lose weight. Ask what an ideal weight is for you.  Get at least 30 minutes of exercise that causes your heart to beat faster (aerobic exercise) most days of the week. Activities may include walking, swimming, or biking.  Work with your health care provider or diet and nutrition specialist (dietitian) to adjust your eating plan to your individual calorie needs. Reading food labels   Check food labels for the amount of sodium per serving. Choose foods with less than 5 percent of the Daily Value of sodium. Generally, foods with less than 300 mg of sodium per serving fit into this eating plan.  To find whole grains, look for the word "whole" as the first word in the  ingredient list. Shopping  Buy products labeled as "low-sodium" or "no salt added."  Buy fresh foods. Avoid canned foods and premade or frozen meals. Cooking  Avoid adding salt when cooking. Use salt-free seasonings or herbs instead of table salt or sea salt. Check with your health care provider or pharmacist before using salt substitutes.  Do not fry foods. Cook foods using healthy methods such as baking, boiling, grilling, and broiling instead.  Cook with heart-healthy oils, such as olive, canola, soybean, or sunflower oil. Meal planning  Eat a balanced diet that includes: ? 5 or more servings of fruits and vegetables each day. At each meal, try to fill half of your plate with fruits and vegetables. ? Up to 6-8 servings of whole grains each day. ? Less than 6 oz of lean meat, poultry, or fish each day. A 3-oz serving of meat is about the same size as a deck of cards. One egg equals 1 oz. ? 2 servings of low-fat dairy each day. ? A serving of nuts, seeds, or beans 5 times each week. ? Heart-healthy fats. Healthy fats called Omega-3 fatty acids are found in foods such as flaxseeds and coldwater fish, like sardines, salmon, and mackerel.  Limit how much you eat of the following: ? Canned or prepackaged foods. ? Food that is high in trans fat, such as fried foods. ?  Food that is high in saturated fat, such as fatty meat. ? Sweets, desserts, sugary drinks, and other foods with added sugar. ? Full-fat dairy products.  Do not salt foods before eating.  Try to eat at least 2 vegetarian meals each week.  Eat more home-cooked food and less restaurant, buffet, and fast food.  When eating at a restaurant, ask that your food be prepared with less salt or no salt, if possible. What foods are recommended? The items listed may not be a complete list. Talk with your dietitian about what dietary choices are best for you. Grains Whole-grain or whole-wheat bread. Whole-grain or whole-wheat  pasta. Brown rice. [place]. Bulgur. Whole-grain and low-sodium cereals. Pita bread. Low-fat, low-sodium crackers. Whole-wheat flour tortillas. Vegetables Fresh or frozen vegetables (raw, steamed, roasted, or grilled). Low-sodium or reduced-sodium tomato and vegetable juice. Low-sodium or reduced-sodium tomato sauce and tomato paste. Low-sodium or reduced-sodium canned vegetables. Fruits All fresh, dried, or frozen fruit. Canned fruit in natural juice (without added sugar). Meat and other protein foods Skinless chicken or [place]. Ground chicken or [place]. Pork with fat trimmed off. Fish and seafood. Egg whites. Dried beans, peas, or lentils. Unsalted nuts, nut butters, and seeds. Unsalted canned beans. Lean cuts of beef with fat trimmed off. Low-sodium, lean deli meat. Dairy Low-fat (1%) or fat-free (skim) milk. Fat-free, low-fat, or reduced-fat cheeses. Nonfat, low-sodium ricotta or cottage cheese. Low-fat or nonfat yogurt. Low-fat, low-sodium cheese. Fats and oils Soft margarine without trans fats. Vegetable oil. Low-fat, reduced-fat, or light mayonnaise and salad dressings (reduced-sodium). Canola, safflower, olive, soybean, and sunflower oils. Avocado. Seasoning and other foods Herbs. Spices. Seasoning mixes without salt. Unsalted popcorn and pretzels. Fat-free sweets. What foods are not recommended? The items listed may not be a complete list. Talk with your dietitian about what dietary choices are best for you. Grains Baked goods made with fat, such as croissants, muffins, or some breads. Dry pasta or rice meal packs. Vegetables Creamed or fried vegetables. Vegetables in a cheese sauce. Regular canned vegetables (not low-sodium or reduced-sodium). Regular canned tomato sauce and paste (not low-sodium or reduced-sodium). Regular tomato and vegetable juice (not low-sodium or reduced-sodium). Angie Fava. Olives. Fruits Canned fruit in a light or heavy syrup. Fried fruit. Fruit in cream or  butter sauce. Meat and other protein foods Fatty cuts of meat. Ribs. Fried meat. Berniece Salines. Sausage. Bologna and other processed lunch meats. Salami. Fatback. Hotdogs. Bratwurst. Salted nuts and seeds. Canned beans with added salt. Canned or smoked fish. Whole eggs or egg yolks. Chicken or [place] with skin. Dairy Whole or 2% milk, cream, and half-and-half. Whole or full-fat cream cheese. Whole-fat or sweetened yogurt. Full-fat cheese. Nondairy creamers. Whipped toppings. Processed cheese and cheese spreads. Fats and oils Butter. Stick margarine. Lard. Shortening. Ghee. Bacon fat. Tropical oils, such as coconut, palm kernel, or palm oil. Seasoning and other foods Salted popcorn and pretzels. Onion salt, garlic salt, seasoned salt, table salt, and sea salt. Worcestershire sauce. Tartar sauce. Barbecue sauce. Teriyaki sauce. Soy sauce, including reduced-sodium. Steak sauce. Canned and packaged gravies. Fish sauce. Oyster sauce. Cocktail sauce. Horseradish that you find on the shelf. Ketchup. Mustard. Meat flavorings and tenderizers. Bouillon cubes. Hot sauce and Tabasco sauce. Premade or packaged marinades. Premade or packaged taco seasonings. Relishes. Regular salad dressings. Where to find more information:  National Heart, Lung, and [place]: https://wilson-eaton.com/  American Heart Association: www.heart.org Summary  The DASH eating plan is a healthy eating plan that has been shown to reduce high blood  pressure (hypertension). It may also reduce your risk for type 2 diabetes, heart disease, and stroke.  With the DASH eating plan, you should limit salt (sodium) intake to 2,300 mg a day. If you have hypertension, you may need to reduce your sodium intake to 1,500 mg a day.  When on the DASH eating plan, aim to eat more fresh fruits and vegetables, whole grains, lean proteins, low-fat dairy, and heart-healthy fats.  Work with your health care provider or diet and nutrition specialist (dietitian) to  adjust your eating plan to your individual calorie needs. This information is not intended to replace advice given to you by your health care provider. Make sure you discuss any questions you have with your health care provider. Document Revised: 04/09/2017 Document Reviewed: 04/20/2016 Elsevier Patient Education  2020 [employer].

## 2019-11-21 NOTE — Progress Notes (Signed)
Subjective: CC: f/u BP PCP: Janora Norlander, DO [id number] L Colleen Gonzalez is a 54 y.o. female presenting to clinic today for:  1. HTN, obesity Patient is that she is been having elevated blood pressures over the last several checkups she has had.  She has continued to see her OB/GYN in [place] over the last couple of years and blood pressures have been elevated there.  Most recently she is getting a checkup done at work and her blood pressure was noted to be elevated.  She denies any symptomology including headache, chest pain, shortness of breath, dizziness, lower extremity edema.  She is a non-smoker.  She does take Voltaren 75 mg twice daily fairly regularly for plantar fasciitis.  She was recently taken off of her Ortho Micronor in May.  She is on no other hormone replacement.  She does admit to eating quite a few convenience foods.  However, she never adds salt to anything.  She works second shift 7 days a week and often does not have time to prepare her own meals.   ROS: Per HPI  No Known Allergies Past Medical History:  Diagnosis Date  . Acne   . Allergy   . Anemia   . Eczema     Current Outpatient Medications:  .  acetaminophen (TYLENOL) 500 MG tablet, Take 500 mg by mouth every 6 (six) hours as needed., Disp: , Rfl:  .  diclofenac (VOLTAREN) 75 MG EC tablet, Take 1 tablet (75 mg total) by mouth 2 (two) times daily., Disp: 50 tablet, Rfl: 2 .  doxycycline (ADOXA) 100 MG tablet, Take 100 mg by mouth daily as needed., Disp: , Rfl:  .  ferrous sulfate 325 (65 FE) MG tablet, Take 325 mg by mouth daily with breakfast., Disp: , Rfl:  .  tretinoin (RETIN-A) 0.05 % cream, APPLY A PEA SIZED AMOUNT ON THE FACE/SKIN NIGHTLY, Disp: , Rfl:  Social History   Socioeconomic History  . Marital status: Single    Spouse name: Not on file  . Number of children: 0  . Years of education: 42  . Highest education level: Not on file  Occupational History  . Occupation: operator(module)     Employer: [employer]  Tobacco Use  . Smoking status: Never Smoker  . Smokeless tobacco: Never Used  Vaping Use  . Vaping Use: Never used  Substance and Sexual Activity  . Alcohol use: Yes    Alcohol/week: 0.0 standard drinks    Comment: OCC  . Drug use: No  . Sexual activity: Yes    Partners: Male    Birth control/protection: None    Comment: 1st intercourse 22 yo-5 partners  Other Topics Concern  . Not on file  Social History Narrative  . Not on file   Social Determinants of Health   Financial Resource Strain:   . Difficulty of Paying Living Expenses:   Food Insecurity:   . Worried About Charity fundraiser in the Last Year:   . Arboriculturist in the Last Year:   Transportation Needs:   . Film/video editor (Medical):   Marland Kitchen Lack of Transportation (Non-Medical):   Physical Activity:   . Days of Exercise per Week:   . Minutes of Exercise per Session:   Stress:   . Feeling of Stress :   Social Connections:   . Frequency of Communication with Friends and Family:   . Frequency of Social Gatherings with Friends and Family:   . Attends Religious Services:   .  Active Member of Clubs or Organizations:   . Attends Archivist Meetings:   Marland Kitchen Marital Status:   Intimate Partner Violence:   . Fear of Current or Ex-Partner:   . Emotionally Abused:   Marland Kitchen Physically Abused:   . Sexually Abused:    Family History  Problem Relation Age of Onset  . Heart disease Mother        ANGINA; MI @ 66  . Skin cancer Mother   . Other Mother        transverse myelitis  . Diabetes Father   . Hypertension Father   . Stroke Father   . Breast cancer Paternal Aunt 41  . Hypertension Maternal Grandmother   . Uterine cancer Maternal Grandmother   . Stroke Maternal Grandfather     Objective: Office vital signs reviewed. BP (!) 142/91   Pulse 75   Temp 97.6 F (36.4 C) (Temporal)   Ht '5\' 3"'  (1.6 m)   Wt 191 lb (86.6 kg)   SpO2 97%   BMI 33.83 kg/m   Physical  Examination:  General: Awake, alert, well nourished, No acute distress HEENT: Normal, sclera white.  Most mucous membranes.  No carotid bruits Cardio: regular rate and rhythm, S1S2 heard, no murmurs appreciated Pulm: clear to auscultation bilaterally, no wheezes, rhonchi or rales; normal work of breathing on room air Extremities: warm, well perfused, No edema, cyanosis or clubbing; +2 pulses bilaterally MSK: normal gait and station  Assessment/ Plan: 54 y.o. female   1. Essential hypertension New diagnosis.  I reviewed her previous specialty notes and do see that she has had persistent elevation in blood pressures.  Likely due to dietary salt intake.  Recommended DASH diet for the next 2 weeks.  We spent a bit of time discussing hidden salts, ways to meal prep in advance.  She will come in for repeat blood pressure check with nurse and if persistently above goal of 140/90, plan to initiate Norvasc 5 mg daily.  Collect fasting labs today.  Will CC to her OB/GYN - CMP14+EGFR  2. Obesity (BMI 30.0-34.9) Lifestyle modification as above - CMP14+EGFR - Lipid Panel - TSH - VITAMIN D 25 Hydroxy (Vit-D Deficiency, Fractures)  3. Screening, anemia, deficiency, iron - CBC  4. Vitamin D insufficiency - VITAMIN D 25 Hydroxy (Vit-D Deficiency, Fractures)   No orders of the defined types were placed in this encounter.  No orders of the defined types were placed in this encounter.    Janora Norlander, DO [place] [phone]

## 2019-11-22 LAB — LIPID PANEL
Chol/HDL Ratio: 2.5 ratio (ref 0.0–4.4)
Cholesterol, Total: 160 mg/dL (ref 100–199)
HDL: 65 mg/dL (ref >39)
LDL Chol Calc (NIH): 82 mg/dL (ref 0–99)
Triglycerides: 63 mg/dL (ref 0–149)
VLDL Cholesterol Cal: 13 mg/dL (ref 5–40)

## 2019-11-22 LAB — CMP14+EGFR
ALT: 50 IU/L — ABNORMAL HIGH (ref 0–32)
AST: 32 IU/L (ref 0–40)
Albumin/Globulin Ratio: 2 (ref 1.2–2.2)
Albumin: 4.2 g/dL (ref 3.8–4.9)
Alkaline Phosphatase: 93 IU/L (ref 48–121)
BUN/Creatinine Ratio: 17 (ref 9–23)
BUN: 16 mg/dL (ref 6–24)
Bilirubin Total: 0.4 mg/dL (ref 0.0–1.2)
CO2: 23 mmol/L (ref 20–29)
Calcium: 8.7 mg/dL (ref 8.7–10.2)
Chloride: 103 mmol/L (ref 96–106)
Creatinine, Ser: 0.92 mg/dL (ref 0.57–1.00)
GFR calc Af Amer: 83 mL/min/{1.73_m2} (ref >59)
GFR calc non Af Amer: 72 mL/min/{1.73_m2} (ref >59)
Globulin, Total: 2.1 g/dL (ref 1.5–4.5)
Glucose: 92 mg/dL (ref 65–99)
Potassium: 3.9 mmol/L (ref 3.5–5.2)
Sodium: 137 mmol/L (ref 134–144)
Total Protein: 6.3 g/dL (ref 6.0–8.5)

## 2019-11-22 LAB — CBC
Hematocrit: 32.7 % — ABNORMAL LOW (ref 34.0–46.6)
Hemoglobin: 10.7 g/dL — ABNORMAL LOW (ref 11.1–15.9)
MCH: 28.7 pg (ref 26.6–33.0)
MCHC: 32.7 g/dL (ref 31.5–35.7)
MCV: 88 fL (ref 79–97)
Platelets: 306 10*3/uL (ref 150–450)
RBC: 3.73 x10E6/uL — ABNORMAL LOW (ref 3.77–5.28)
RDW: 13.2 % (ref 11.7–15.4)
WBC: 5.3 10*3/uL (ref 3.4–10.8)

## 2019-11-22 LAB — VITAMIN D 25 HYDROXY (VIT D DEFICIENCY, FRACTURES): Vit D, 25-Hydroxy: 28.4 ng/mL — ABNORMAL LOW (ref 30.0–100.0)

## 2019-11-22 LAB — TSH: TSH: 3.3 u[IU]/mL (ref 0.450–4.500)

## 2019-11-23 ENCOUNTER — Telehealth: Payer: Self-pay | Admitting: Family Medicine

## 2019-11-23 NOTE — Telephone Encounter (Signed)
Pt aware of results and recommendations and voiced understanding. 

## 2019-12-12 ENCOUNTER — Ambulatory Visit: Payer: 59 | Admitting: *Deleted

## 2019-12-12 ENCOUNTER — Other Ambulatory Visit: Payer: Self-pay

## 2019-12-12 DIAGNOSIS — Z0130 Encounter for examination of blood pressure without abnormal findings: Secondary | ICD-10-CM

## 2019-12-12 NOTE — Progress Notes (Signed)
This is acceptable range

## 2019-12-12 NOTE — Progress Notes (Signed)
BP recheck today was 139/85

## 2020-01-08 ENCOUNTER — Ambulatory Visit (INDEPENDENT_AMBULATORY_CARE_PROVIDER_SITE_OTHER): Payer: 59 | Admitting: Obstetrics & Gynecology

## 2020-01-08 ENCOUNTER — Encounter: Payer: Self-pay | Admitting: Obstetrics & Gynecology

## 2020-01-08 ENCOUNTER — Other Ambulatory Visit: Payer: Self-pay

## 2020-01-08 VITALS — BP 128/90 | Ht 63.0 in | Wt 183.0 lb

## 2020-01-08 DIAGNOSIS — E6609 Other obesity due to excess calories: Secondary | ICD-10-CM | POA: Diagnosis not present

## 2020-01-08 DIAGNOSIS — Z01419 Encounter for gynecological examination (general) (routine) without abnormal findings: Secondary | ICD-10-CM | POA: Diagnosis not present

## 2020-01-08 DIAGNOSIS — Z3041 Encounter for surveillance of contraceptive pills: Secondary | ICD-10-CM | POA: Diagnosis not present

## 2020-01-08 DIAGNOSIS — Z6832 Body mass index (BMI) 32.0-32.9, adult: Secondary | ICD-10-CM | POA: Diagnosis not present

## 2020-01-08 MED ORDER — NORETHINDRONE 0.35 MG PO TABS
1.0000 | ORAL_TABLET | Freq: Every day | ORAL | 4 refills | Status: DC
Start: 2020-01-08 — End: 2021-01-30

## 2020-01-08 NOTE — Progress Notes (Signed)
  Colleen Gonzalez 02/21/1966 [id number]   History:    54 y.o.  G0 Stable boyfriend  RP:  Established patient presenting for annual gyn exam   HPI: Well on Micronor.  No pelvic pain.  Normal vaginal secretions.  Urine/BMs normal.  Breasts normal.  BMI improved to 32.42.  Health labs here today.  Past medical history,surgical history, family history and social history were all reviewed and documented in the EPIC chart.  Gynecologic History Patient's last menstrual period was 11/13/2019.  Obstetric History OB History  Gravida Para Term Preterm AB Living  0 0 0 0 0 0  SAB TAB Ectopic Multiple Live Births  0 0 0 0 0     ROS: A ROS was performed and pertinent positives and negatives are included in the history.  GENERAL: No fevers or chills. HEENT: No change in vision, no earache, sore throat or sinus congestion. NECK: No pain or stiffness. CARDIOVASCULAR: No chest pain or pressure. No palpitations. PULMONARY: No shortness of breath, cough or wheeze. GASTROINTESTINAL: No abdominal pain, nausea, vomiting or diarrhea, melena or bright red blood per rectum. GENITOURINARY: No urinary frequency, urgency, hesitancy or dysuria. MUSCULOSKELETAL: No joint or muscle pain, no back pain, no recent trauma. DERMATOLOGIC: No rash, no itching, no lesions. ENDOCRINE: No polyuria, polydipsia, no heat or cold intolerance. No recent change in weight. HEMATOLOGICAL: No anemia or easy bruising or bleeding. NEUROLOGIC: No headache, seizures, numbness, tingling or weakness. PSYCHIATRIC: No depression, no loss of interest in normal activity or change in sleep pattern.     Exam:   BP 128/90   Ht 5' 3" (1.6 m)   Wt 183 lb (83 kg)   LMP 11/13/2019   BMI 32.42 kg/m   Body mass index is 32.42 kg/m.  General appearance : Well developed well nourished female. No acute distress HEENT: Eyes: no retinal hemorrhage or exudates,  Neck supple, trachea midline, no carotid bruits, no thyroidmegaly Lungs: Clear to  auscultation, no rhonchi or wheezes, or rib retractions  Heart: Regular rate and rhythm, no murmurs or gallops Breast:Examined in sitting and supine position were symmetrical in appearance, no palpable masses or tenderness,  no skin retraction, no nipple inversion, no nipple discharge, no skin discoloration, no axillary or supraclavicular lymphadenopathy Abdomen: no palpable masses or tenderness, no rebound or guarding Extremities: no edema or skin discoloration or tenderness  Pelvic: Vulva: Normal             Vagina: No gross lesions or discharge  Cervix: No gross lesions or discharge.  Pap reflex done  Uterus AV, normal size, shape and consistency, non-tender and mobile  Adnexa  Without masses or tenderness  Anus: Normal   Assessment/Plan:  54 y.o. female for annual exam   1. Encounter for routine gynecological examination with Papanicolaou smear of cervix Normal gynecologic exam.  Pap reflex done.  Breast exam normal.  Screening mammogram October 2020 was negative.  Colonoscopy 2015.  Fasting health labs here today. - CBC - Comp Met (CMET) - TSH - Lipid panel - VITAMIN D 25 Hydroxy (Vit-D Deficiency, Fractures)  2. Encounter for surveillance of contraceptive pills Well on the progestin only birth control pills.  Also taking the birth control pill to decrease the menstrual flow.  Last hemoglobin July 2021 was stable at 10.7.  Recommend increasing iron supplement FeSO4 325 mg to twice a day.  No contraindication to continue on the Progestin pill.  Prescription sent to pharmacy.  3. Class 1 obesity due to excess   calories without serious comorbidity with body mass index (BMI) of 32.0 to 32.9 in adult Patient lost weight since last year.  We will continue on a low calorie/carb diet.  Patient is very active at work.  Aerobic activities 5 times a week and light weightlifting every 2 days recommended.  Other orders - norethindrone (MICRONOR) 0.35 MG tablet; Take 1 tablet (0.35 mg total) by  mouth daily.  Marie-Lyne Lavoie MD, 11:49 AM 01/08/2020   

## 2020-01-09 ENCOUNTER — Encounter: Payer: 59 | Admitting: Obstetrics & Gynecology

## 2020-01-09 LAB — PAP IG W/ RFLX HPV ASCU

## 2020-01-09 LAB — COMPREHENSIVE METABOLIC PANEL
AG Ratio: 2 (calc) (ref 1.0–2.5)
ALT: 11 U/L (ref 6–29)
AST: 12 U/L (ref 10–35)
Albumin: 4.4 g/dL (ref 3.6–5.1)
Alkaline phosphatase (APISO): 74 U/L (ref 37–153)
BUN: 18 mg/dL (ref 7–25)
CO2: 26 mmol/L (ref 20–32)
Calcium: 9.4 mg/dL (ref 8.6–10.4)
Chloride: 105 mmol/L (ref 98–110)
Creat: 0.96 mg/dL (ref 0.50–1.05)
Globulin: 2.2 g/dL (calc) (ref 1.9–3.7)
Glucose, Bld: 92 mg/dL (ref 65–99)
Potassium: 4.4 mmol/L (ref 3.5–5.3)
Sodium: 138 mmol/L (ref 135–146)
Total Bilirubin: 0.3 mg/dL (ref 0.2–1.2)
Total Protein: 6.6 g/dL (ref 6.1–8.1)

## 2020-01-09 LAB — CBC
HCT: 36.9 % (ref 35.0–45.0)
Hemoglobin: 11.7 g/dL (ref 11.7–15.5)
MCH: 28.4 pg (ref 27.0–33.0)
MCHC: 31.7 g/dL — ABNORMAL LOW (ref 32.0–36.0)
MCV: 89.6 fL (ref 80.0–100.0)
MPV: 10.4 fL (ref 7.5–12.5)
Platelets: 320 10*3/uL (ref 140–400)
RBC: 4.12 10*6/uL (ref 3.80–5.10)
RDW: 12.4 % (ref 11.0–15.0)
WBC: 5.3 10*3/uL (ref 3.8–10.8)

## 2020-01-09 LAB — LIPID PANEL
Cholesterol: 155 mg/dL (ref <200)
HDL: 63 mg/dL (ref >=50)
LDL Cholesterol (Calc): 79 mg/dL (calc)
Non-HDL Cholesterol (Calc): 92 mg/dL (calc) (ref <130)
Total CHOL/HDL Ratio: 2.5 (calc) (ref <5.0)
Triglycerides: 53 mg/dL (ref <150)

## 2020-01-09 LAB — TSH: TSH: 2.55 mIU/L

## 2020-01-09 LAB — VITAMIN D 25 HYDROXY (VIT D DEFICIENCY, FRACTURES): Vit D, 25-Hydroxy: 32 ng/mL (ref 30–100)

## 2020-01-23 ENCOUNTER — Other Ambulatory Visit: Payer: Self-pay | Admitting: Obstetrics & Gynecology

## 2020-01-23 DIAGNOSIS — Z1231 Encounter for screening mammogram for malignant neoplasm of breast: Secondary | ICD-10-CM

## 2020-02-26 ENCOUNTER — Ambulatory Visit
Admission: RE | Admit: 2020-02-26 | Discharge: 2020-02-26 | Disposition: A | Discharge status: Home / Self Care | Payer: 59 | Source: Ambulatory Visit | Attending: Obstetrics & Gynecology | Admitting: Obstetrics & Gynecology

## 2020-02-26 ENCOUNTER — Other Ambulatory Visit: Payer: Self-pay

## 2020-02-26 DIAGNOSIS — Z1231 Encounter for screening mammogram for malignant neoplasm of breast: Secondary | ICD-10-CM

## 2020-06-12 ENCOUNTER — Telehealth: Payer: Self-pay | Admitting: Podiatry

## 2020-06-12 NOTE — Telephone Encounter (Signed)
Pt left message stating its been over 6 months and she would like to order another pair of orthotics.  I returned call and left message for pt to call and talk with me about it. Is she wanting same exact pair, is the  insurance still the same and are we billing insurance. It has been a yr since last ones were ordered.

## 2020-06-13 DIAGNOSIS — M722 Plantar fascial fibromatosis: Secondary | ICD-10-CM

## 2020-06-13 NOTE — Telephone Encounter (Signed)
Left message for pt that I did get her message and have give rick the information to order another pair of orthotics and would call when they come in.. it has been taking about 4 wks to come in.

## 2020-06-13 NOTE — Telephone Encounter (Signed)
Pt left message  @ 1211pm on 2.3.2022 returning my call stating order the same orthotics and to bill insurance and to leave a message when they are back and she can pick up.

## 2020-09-16 ENCOUNTER — Other Ambulatory Visit: Payer: Self-pay | Admitting: Podiatry

## 2020-09-16 NOTE — Telephone Encounter (Signed)
Please advise 

## 2021-01-19 IMAGING — MG DIGITAL SCREENING BILAT W/ CAD
4 series · 4 of 4 positions shown · non-contrast
Comparison: Previous exam(s).

CLINICAL DATA: Screening.

EXAM:
DIGITAL SCREENING BILATERAL MAMMOGRAM WITH CAD

[L MLO]
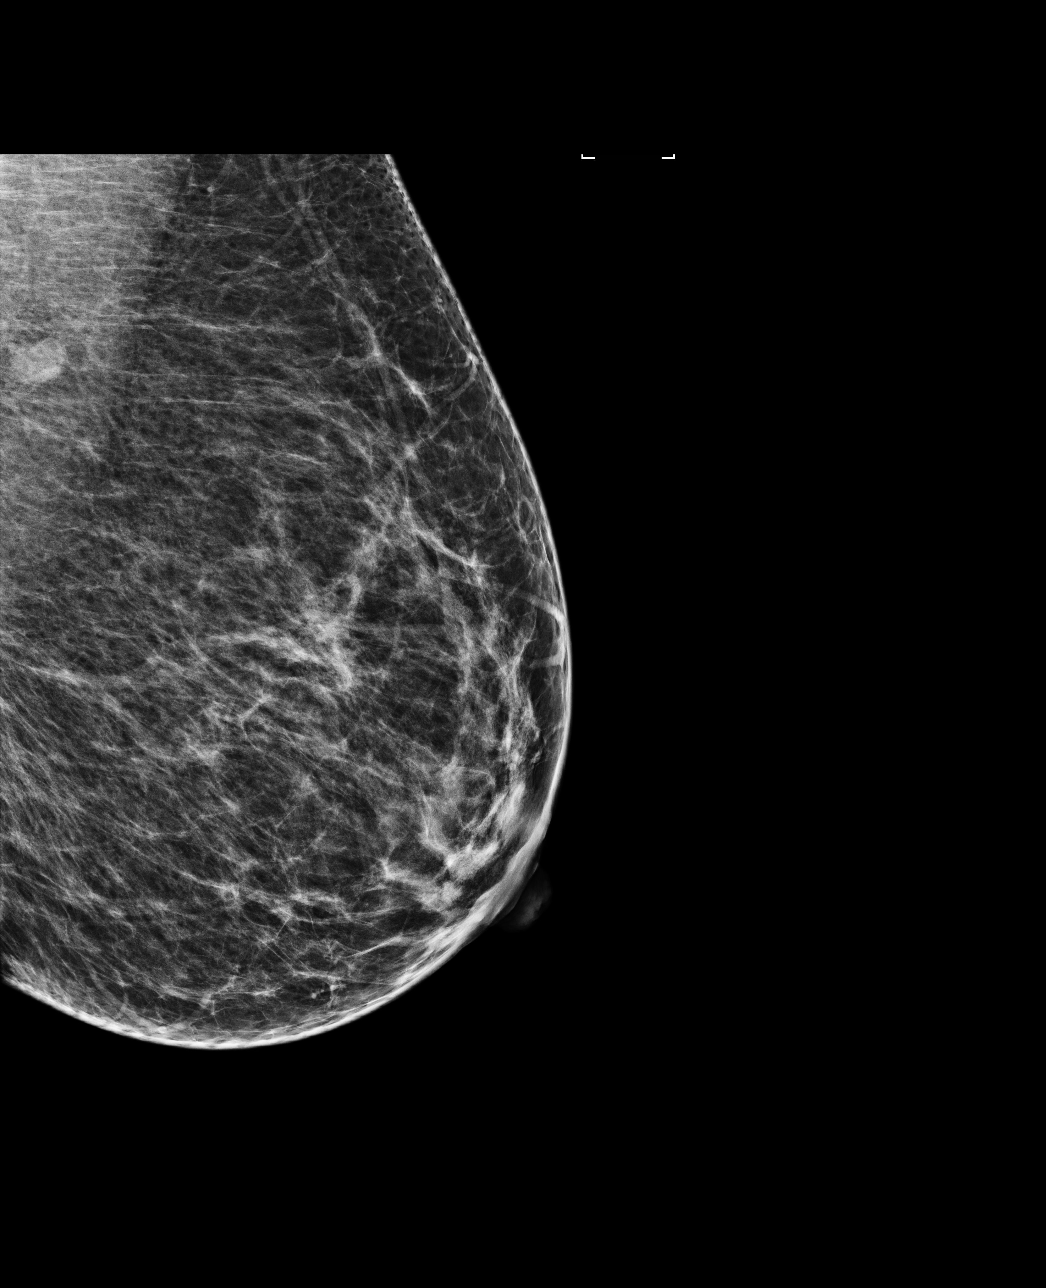

[L CC]
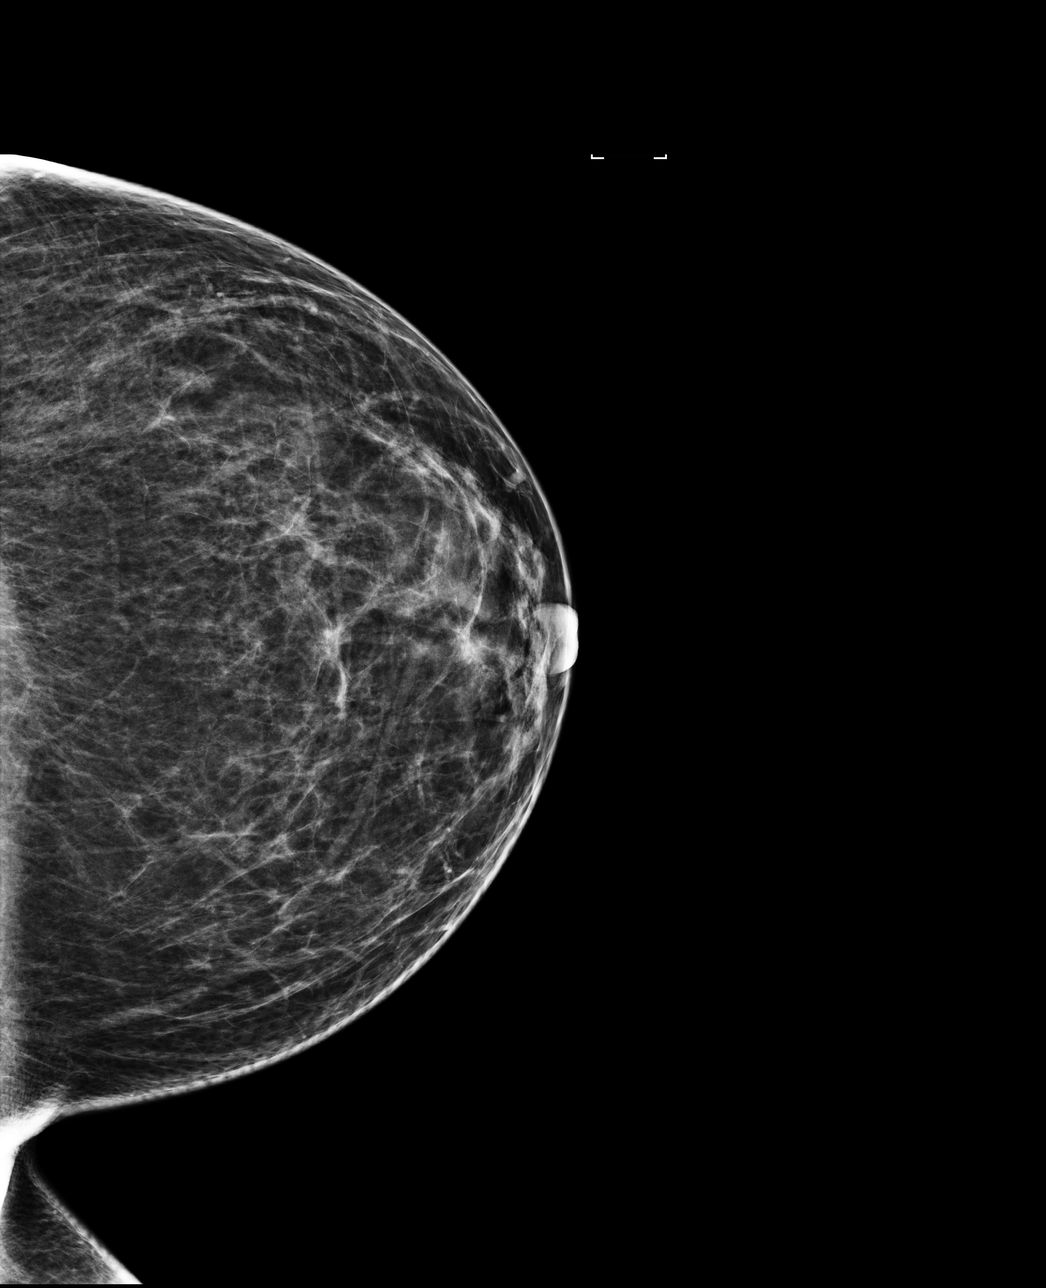

[R CC]
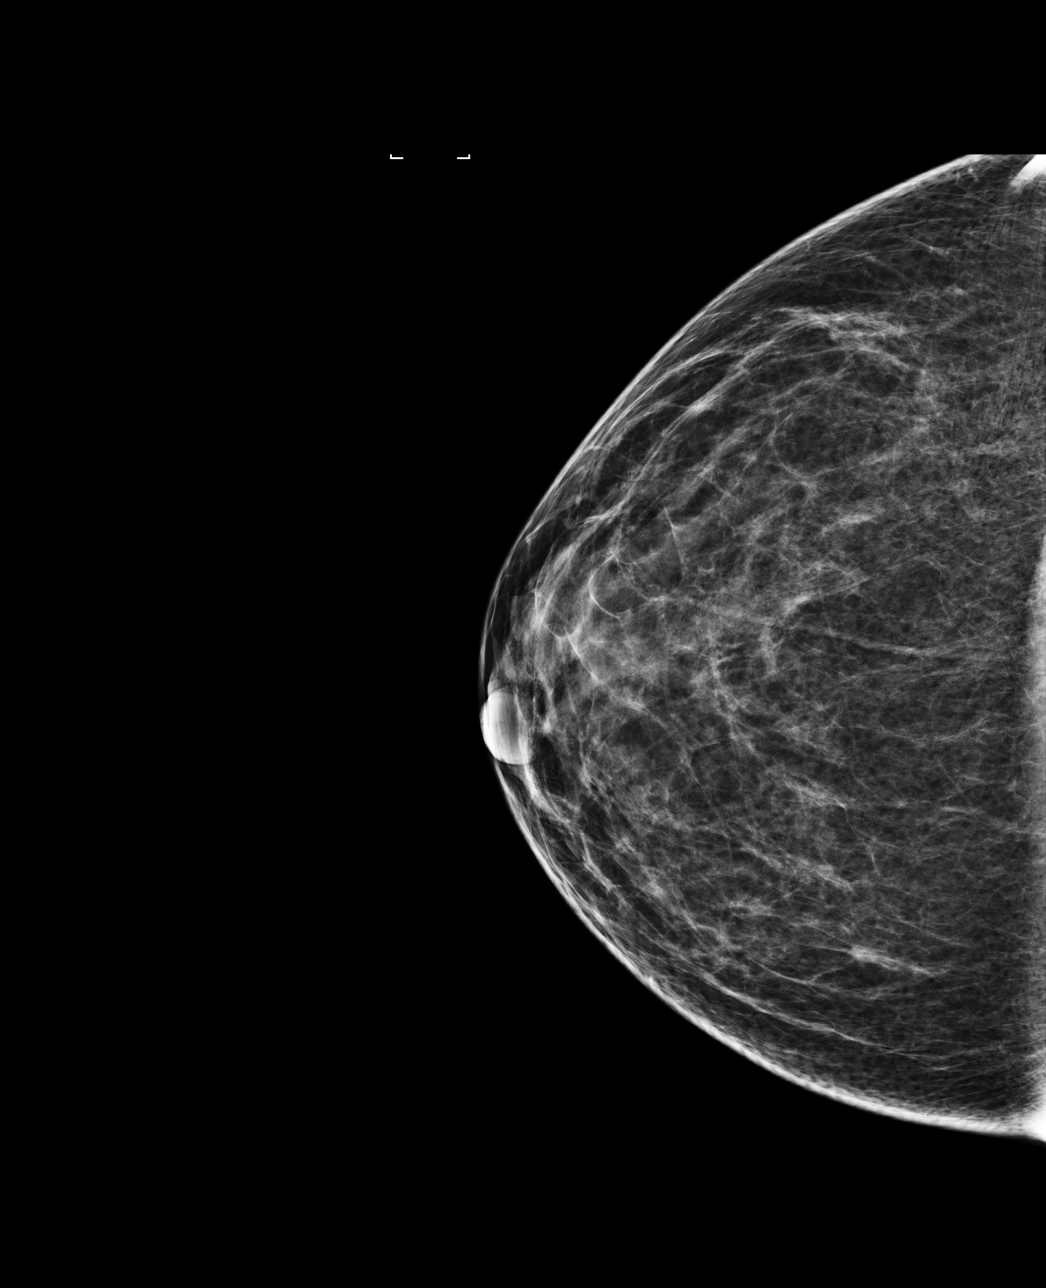

[R MLO]
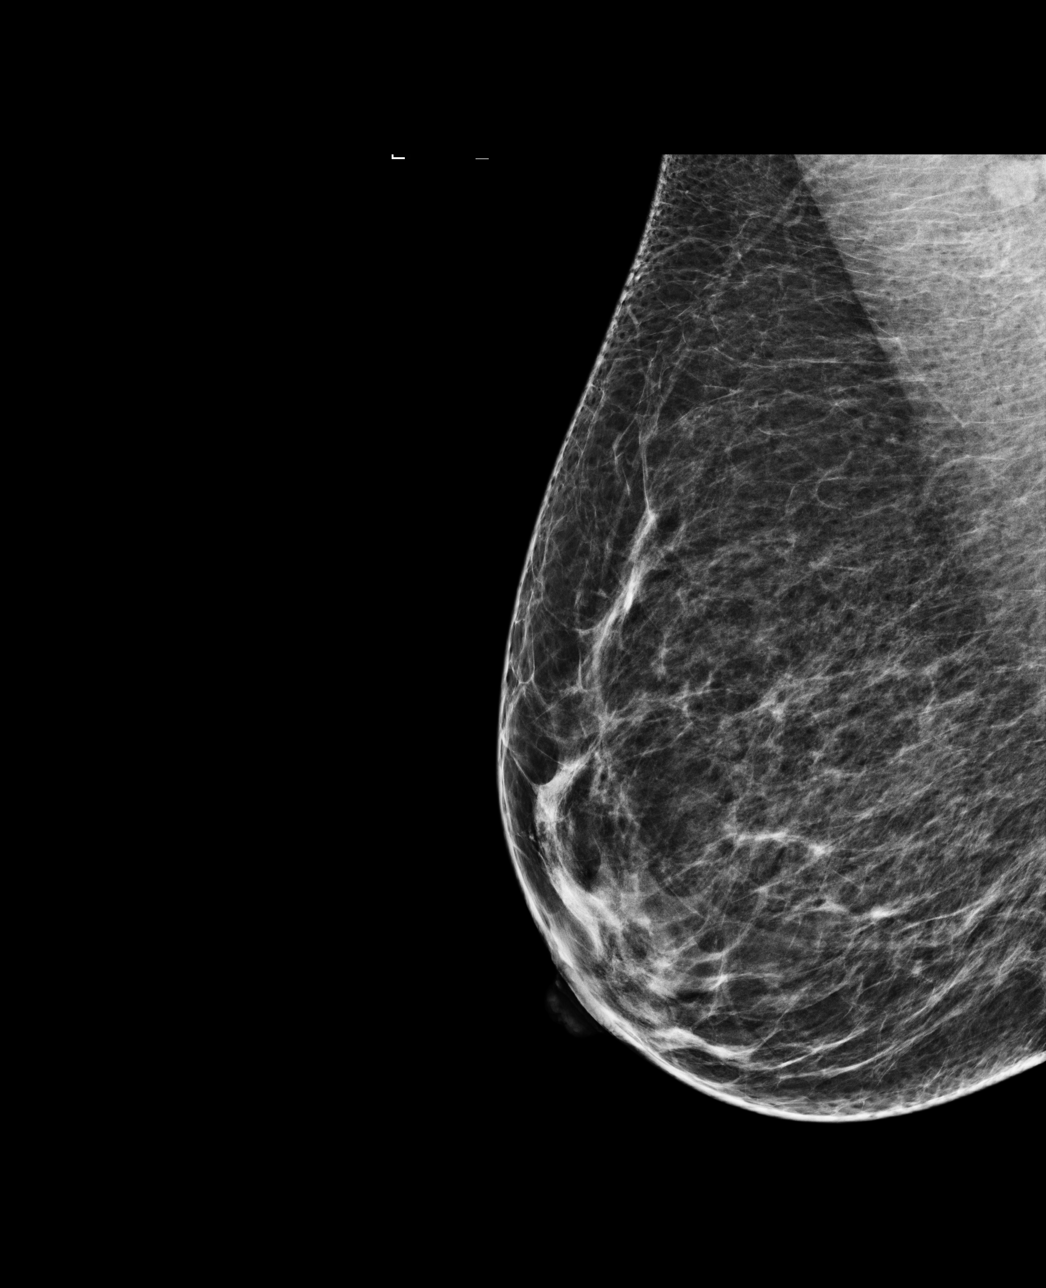

[4 of 4 positions shown; findings below may reference images not displayed]

ACR Breast Density Category b: There are scattered areas of
fibroglandular density.
FINDINGS: There are no findings suspicious for malignancy. Images were
processed with CAD.
IMPRESSION: No mammographic evidence of malignancy. A result letter of this
screening mammogram will be mailed directly to the patient.

RECOMMENDATION:
Screening mammogram in one year. (Code:AS-G-LCT)

BI-RADS CATEGORY  1: Negative.

## 2021-01-27 ENCOUNTER — Other Ambulatory Visit: Payer: Self-pay | Admitting: Obstetrics & Gynecology

## 2021-01-27 DIAGNOSIS — Z1231 Encounter for screening mammogram for malignant neoplasm of breast: Secondary | ICD-10-CM

## 2021-01-30 ENCOUNTER — Encounter: Payer: Self-pay | Admitting: Obstetrics & Gynecology

## 2021-01-30 ENCOUNTER — Other Ambulatory Visit: Payer: Self-pay

## 2021-01-30 ENCOUNTER — Ambulatory Visit (INDEPENDENT_AMBULATORY_CARE_PROVIDER_SITE_OTHER): Payer: 59 | Admitting: Obstetrics & Gynecology

## 2021-01-30 VITALS — BP 116/76 | HR 73 | Resp 16 | Ht 62.75 in | Wt 186.0 lb

## 2021-01-30 DIAGNOSIS — N921 Excessive and frequent menstruation with irregular cycle: Secondary | ICD-10-CM

## 2021-01-30 DIAGNOSIS — Z01419 Encounter for gynecological examination (general) (routine) without abnormal findings: Secondary | ICD-10-CM | POA: Diagnosis not present

## 2021-01-30 DIAGNOSIS — E6609 Other obesity due to excess calories: Secondary | ICD-10-CM | POA: Diagnosis not present

## 2021-01-30 DIAGNOSIS — Z3041 Encounter for surveillance of contraceptive pills: Secondary | ICD-10-CM | POA: Diagnosis not present

## 2021-01-30 DIAGNOSIS — Z6833 Body mass index (BMI) 33.0-33.9, adult: Secondary | ICD-10-CM

## 2021-01-30 MED ORDER — NORETHINDRONE 0.35 MG PO TABS: 1.0000 | ORAL_TABLET | Freq: Every day | ORAL | 4 refills | Status: DC

## 2021-01-30 NOTE — Progress Notes (Signed)
Colleen Gonzalez 1965-06-25 [id number]   History:    55 y.o.  G0 Stable boyfriend   RP:  Established patient presenting for annual gyn exam    HPI: Well on Micronor, but increasing BTB.  No pelvic pain.  Normal vaginal secretions.  No pain with IC.  Pap Neg 2021.  Urine/BMs normal.  Breasts normal.  BMI 33.21.  Health labs here today.   Past medical history,surgical history, family history and social history were all reviewed and documented in the EPIC chart.  Gynecologic History Patient's last menstrual period was 01/22/2021.  Obstetric History OB History  Gravida Para Term Preterm AB Living  0 0 0 0 0 0  SAB IAB Ectopic Multiple Live Births  0 0 0 0 0     ROS: A ROS was performed and pertinent positives and negatives are included in the history.  GENERAL: No fevers or chills. HEENT: No change in vision, no earache, sore throat or sinus congestion. NECK: No pain or stiffness. CARDIOVASCULAR: No chest pain or pressure. No palpitations. PULMONARY: No shortness of breath, cough or wheeze. GASTROINTESTINAL: No abdominal pain, nausea, vomiting or diarrhea, melena or bright red blood per rectum. GENITOURINARY: No urinary frequency, urgency, hesitancy or dysuria. MUSCULOSKELETAL: No joint or muscle pain, no back pain, no recent trauma. DERMATOLOGIC: No rash, no itching, no lesions. ENDOCRINE: No polyuria, polydipsia, no heat or cold intolerance. No recent change in weight. HEMATOLOGICAL: No anemia or easy bruising or bleeding. NEUROLOGIC: No headache, seizures, numbness, tingling or weakness. PSYCHIATRIC: No depression, no loss of interest in normal activity or change in sleep pattern.     Exam:   BP 116/76   Pulse 73   Resp 16   Ht 5' 2.75" (1.594 m)   Wt 186 lb (84.4 kg)   LMP 01/22/2021   BMI 33.21 kg/m   Body mass index is 33.21 kg/m.  General appearance : Well developed well nourished female. No acute distress HEENT: Eyes: no retinal hemorrhage or exudates,  Neck supple,  trachea midline, no carotid bruits, no thyroidmegaly Lungs: Clear to auscultation, no rhonchi or wheezes, or rib retractions  Heart: Regular rate and rhythm, no murmurs or gallops Breast:Examined in sitting and supine position were symmetrical in appearance, no palpable masses or tenderness,  no skin retraction, no nipple inversion, no nipple discharge, no skin discoloration, no axillary or supraclavicular lymphadenopathy Abdomen: no palpable masses or tenderness, no rebound or guarding Extremities: no edema or skin discoloration or tenderness  Pelvic: Vulva: Normal             Vagina: No gross lesions or discharge  Cervix: No gross lesions or discharge  Uterus  AV, normal size, shape and consistency, non-tender and mobile  Adnexa  Without masses or tenderness  Anus: Normal   Assessment/Plan:  55 y.o. female for annual exam   1. Well female exam with routine gynecological exam Normal gynecologic exam.  Pap test negative August 2021, no indication to repeat a Pap test this year.  Breast exam normal.  Screening mammogram negative October 2021.  Colonoscopy 2015.  Recommend a repeat colonoscopy at this time.  Fasting health labs here today. - CBC - Comp Met (CMET) - TSH - Lipid Profile - Vitamin D 1,25 dihydroxy  2. Encounter for surveillance of contraceptive pills Would like to continue on the progestin birth control pill at this time.  No contraindication.  Prescription sent to pharmacy.  3. Breakthrough bleeding on birth control pills Breakthrough bleeding on the  progestin pill, probably in perimenopause.  We will further investigate with a pelvic ultrasound to evaluate the endometrial lining and rule out submucosal fibroids, endometrial polyps, endometrial hyperplasia and cancer. - US Transvaginal Non-OB; Future  4. Class 1 obesity due to excess calories without serious comorbidity with body mass index (BMI) of 33.0 to 33.9 in adult Recommend a lower calorie/carb diet.  Aerobic  activities 5 times a week and light weightlifting every 2 days.  Other orders - triamcinolone ointment (KENALOG) 0.1 %; Apply topically 2 (two) times daily. - Cholecalciferol (VITAMIN D3 PO); Take by mouth. - norethindrone (MICRONOR) 0.35 MG tablet; Take 1 tablet (0.35 mg total) by mouth daily.   Princess Bruins MD, 12:17 PM 01/30/2021

## 2021-02-03 LAB — LIPID PANEL
Cholesterol: 148 mg/dL (ref <200)
HDL: 62 mg/dL (ref >=50)
LDL Cholesterol (Calc): 74 mg/dL (calc)
Non-HDL Cholesterol (Calc): 86 mg/dL (calc) (ref <130)
Total CHOL/HDL Ratio: 2.4 (calc) (ref <5.0)
Triglycerides: 50 mg/dL (ref <150)

## 2021-02-03 LAB — COMPREHENSIVE METABOLIC PANEL
AG Ratio: 1.7 (calc) (ref 1.0–2.5)
ALT: 12 U/L (ref 6–29)
AST: 14 U/L (ref 10–35)
Albumin: 4.3 g/dL (ref 3.6–5.1)
Alkaline phosphatase (APISO): 71 U/L (ref 37–153)
BUN/Creatinine Ratio: 16 (calc) (ref 6–22)
BUN: 17 mg/dL (ref 7–25)
CO2: 26 mmol/L (ref 20–32)
Calcium: 9 mg/dL (ref 8.6–10.4)
Chloride: 106 mmol/L (ref 98–110)
Creat: 1.08 mg/dL — ABNORMAL HIGH (ref 0.50–1.03)
Globulin: 2.6 g/dL (calc) (ref 1.9–3.7)
Glucose, Bld: 75 mg/dL (ref 65–99)
Potassium: 4.2 mmol/L (ref 3.5–5.3)
Sodium: 139 mmol/L (ref 135–146)
Total Bilirubin: 0.7 mg/dL (ref 0.2–1.2)
Total Protein: 6.9 g/dL (ref 6.1–8.1)

## 2021-02-03 LAB — VITAMIN D 1,25 DIHYDROXY
Vitamin D 1, 25 (OH)2 Total: 42 pg/mL (ref 18–72)
Vitamin D2 1, 25 (OH)2: <8 pg/mL
Vitamin D3 1, 25 (OH)2: 42 pg/mL

## 2021-02-03 LAB — CBC
HCT: 35.5 % (ref 35.0–45.0)
Hemoglobin: 11.4 g/dL — ABNORMAL LOW (ref 11.7–15.5)
MCH: 28.9 pg (ref 27.0–33.0)
MCHC: 32.1 g/dL (ref 32.0–36.0)
MCV: 90.1 fL (ref 80.0–100.0)
MPV: 10.8 fL (ref 7.5–12.5)
Platelets: 317 10*3/uL (ref 140–400)
RBC: 3.94 10*6/uL (ref 3.80–5.10)
RDW: 12.7 % (ref 11.0–15.0)
WBC: 4.1 10*3/uL (ref 3.8–10.8)

## 2021-02-03 LAB — TSH: TSH: 2.05 mIU/L

## 2021-02-03 NOTE — Telephone Encounter (Signed)
Patient called and I read her this message.

## 2021-02-27 ENCOUNTER — Other Ambulatory Visit: Payer: Self-pay

## 2021-02-27 ENCOUNTER — Ambulatory Visit
Admission: RE | Admit: 2021-02-27 | Discharge: 2021-02-27 | Disposition: A | Discharge status: Home / Self Care | Payer: 59 | Source: Ambulatory Visit | Attending: Obstetrics & Gynecology | Admitting: Obstetrics & Gynecology

## 2021-02-27 DIAGNOSIS — Z1231 Encounter for screening mammogram for malignant neoplasm of breast: Secondary | ICD-10-CM

## 2021-03-04 ENCOUNTER — Telehealth: Payer: Self-pay | Admitting: *Deleted

## 2021-03-04 NOTE — Telephone Encounter (Signed)
Patient called and left message in triage voicemail stating she is scheduled for pelvic ultrasound on 03/06/21, however her cycle started today and will be on during ultrasound. Ultrasound was ordered for BTB with Micronor pill. I advised patient she can reschedule at next slot, this message was left on home # per DPR access and patient request.

## 2021-03-06 ENCOUNTER — Other Ambulatory Visit: Payer: 59

## 2021-03-06 ENCOUNTER — Other Ambulatory Visit: Payer: Self-pay | Admitting: Podiatry

## 2021-03-06 ENCOUNTER — Other Ambulatory Visit: Payer: 59 | Admitting: Obstetrics & Gynecology

## 2021-03-06 NOTE — Telephone Encounter (Signed)
Please advise 

## 2021-05-22 ENCOUNTER — Other Ambulatory Visit: Payer: 59

## 2021-05-22 ENCOUNTER — Other Ambulatory Visit: Payer: 59 | Admitting: Obstetrics & Gynecology

## 2021-05-22 DIAGNOSIS — Z0289 Encounter for other administrative examinations: Secondary | ICD-10-CM

## 2021-06-17 ENCOUNTER — Telehealth: Payer: Self-pay | Admitting: Podiatry

## 2021-06-17 NOTE — Telephone Encounter (Signed)
Pt left message yesterday about getting another pair or orthotics,  Upon checking her last office visit was 4.2021 so I left message for pt that she would need an office visit to get another pair of orthotics.

## 2021-07-24 ENCOUNTER — Ambulatory Visit (INDEPENDENT_AMBULATORY_CARE_PROVIDER_SITE_OTHER): Payer: 59 | Admitting: Obstetrics & Gynecology

## 2021-07-24 ENCOUNTER — Other Ambulatory Visit: Payer: Self-pay

## 2021-07-24 ENCOUNTER — Encounter: Payer: Self-pay | Admitting: Obstetrics & Gynecology

## 2021-07-24 ENCOUNTER — Ambulatory Visit: Payer: 59

## 2021-07-24 DIAGNOSIS — N921 Excessive and frequent menstruation with irregular cycle: Secondary | ICD-10-CM

## 2021-07-24 DIAGNOSIS — N951 Menopausal and female climacteric states: Secondary | ICD-10-CM

## 2021-07-24 DIAGNOSIS — D219 Benign neoplasm of connective and other soft tissue, unspecified: Secondary | ICD-10-CM

## 2021-07-24 NOTE — Progress Notes (Signed)
? ? ?  Colleen Gonzalez [birth year] [id number] ? ? ?     56 y.o.  G0 ? ?RP: BTB on the Progestin pill for Pelvic US ? ?HPI: BTB on the Progestin pill with uterine fibroids, had pelvic pressure when bleeding.  Resolved BTB since LMP 05/2021.  No vaginal bleeding at all since then.  No pelvic pain.  No hot flushes or night sweats at this time. ? ? ?OB History  ?Gravida Para Term Preterm AB Living  ?0 0 0 0 0 0  ?SAB IAB Ectopic Multiple Live Births  ?0 0 0 0 0  ? ? ?Past medical history,surgical history, problem list, medications, allergies, family history and social history were all reviewed and documented in the EPIC chart. ? ? ?Directed ROS with pertinent positives and negatives documented in the history of present illness/assessment and plan. ? ?Exam: ? ?There were no vitals filed for this visit. ?General appearance:  Normal ? ?Pelvic US today: T/V images.  Anteverted enlarged uterus with multiple intramural and subserosal fibroids again noted.  The largest fibroid is anterior subserosal measured at 4.5 x 4.8 cm.  The overall uterine size is measured at 9.2 x 7.86 x 8.64 cm.  The endometrial lining is measured at 5.18 mm.  No obvious mass or abnormal vascularity is seen.  Adjacent fibroids make the endometrial lining suboptimally visualized.  Right ovary is normal with a follicular pattern.  Left ovary is atrophic in appearance with no follicles seen.  No adnexal mass.  No free fluid in the pelvis. ? ? ?Assessment/Plan:  56 y.o. G0 ? ?1. Breakthrough bleeding on birth control pills ?BTB on the Progestin pill with uterine fibroids, had pelvic pressure when bleeding.  Resolved BTB since LMP 05/2021.  No vaginal bleeding at all since then.  No pelvic pain.  No hot flushes or night sweats at this time.  Pelvic US findings reviewed thoroughly.  Patient reassured that the fibroids are stable and likely to become smaller when she gets in full menopause.  Thin endometrial lining for perimenopause.  Decision to continue to  control her cycle with the progestin pill. ? ?2. Fibroids ?IM    ? ?3. Perimenopause ?Oligomenorrhea.  LMP 05/2021.  No hot flushes or night sweats at this time.  Decision to observe.  Counseling on abnormal bleeding done. ? ?Other orders ?- Desoximetasone 0.05 % OINT; Apply topically. ?- fluticasone (CUTIVATE) 0.005 % ointment; Apply topically. ?- NU-IRON 150 MG capsule; SMARTSIG:1 By Mouth ?- PROTOPIC 0.1 % ointment; Apply topically.  ? ?Princess Bruins MD, 12:19 PM 07/24/2021 ? ? ? ?  ?

## 2021-08-01 ENCOUNTER — Encounter: Payer: Self-pay | Admitting: Obstetrics & Gynecology

## 2022-01-26 ENCOUNTER — Other Ambulatory Visit: Payer: Self-pay | Admitting: Obstetrics & Gynecology

## 2022-01-26 DIAGNOSIS — Z1231 Encounter for screening mammogram for malignant neoplasm of breast: Secondary | ICD-10-CM

## 2022-02-04 ENCOUNTER — Telehealth: Payer: Self-pay | Admitting: *Deleted

## 2022-02-04 NOTE — Telephone Encounter (Signed)
Patient scheduled for annual exam on 02/06/22 left message in triage voicemail asking if okay to keep with currently bleeding. I called patient back and left detailed message on voicemail per her request if light okay to keep visit.

## 2022-02-06 ENCOUNTER — Encounter: Payer: Self-pay | Admitting: Obstetrics & Gynecology

## 2022-02-06 ENCOUNTER — Ambulatory Visit (INDEPENDENT_AMBULATORY_CARE_PROVIDER_SITE_OTHER): Payer: 59 | Admitting: Obstetrics & Gynecology

## 2022-02-06 VITALS — BP 110/68 | HR 66 | Ht 62.75 in | Wt 188.0 lb

## 2022-02-06 DIAGNOSIS — N951 Menopausal and female climacteric states: Secondary | ICD-10-CM

## 2022-02-06 DIAGNOSIS — B3731 Acute candidiasis of vulva and vagina: Secondary | ICD-10-CM

## 2022-02-06 DIAGNOSIS — D219 Benign neoplasm of connective and other soft tissue, unspecified: Secondary | ICD-10-CM | POA: Diagnosis not present

## 2022-02-06 DIAGNOSIS — Z01419 Encounter for gynecological examination (general) (routine) without abnormal findings: Secondary | ICD-10-CM

## 2022-02-06 DIAGNOSIS — Z3041 Encounter for surveillance of contraceptive pills: Secondary | ICD-10-CM | POA: Diagnosis not present

## 2022-02-06 DIAGNOSIS — E6609 Other obesity due to excess calories: Secondary | ICD-10-CM

## 2022-02-06 MED ORDER — FLUCONAZOLE 150 MG PO TABS
150.0000 mg | ORAL_TABLET | ORAL | 0 refills | Status: AC
Start: 2022-02-06 — End: 2022-02-11

## 2022-02-06 MED ORDER — NORETHINDRONE 0.35 MG PO TABS: 1.0000 | ORAL_TABLET | Freq: Every day | ORAL | 4 refills | Status: DC

## 2022-02-06 NOTE — Progress Notes (Signed)
Colleen Gonzalez November 20, 1965 [id number]   History:    56 y.o. G0 Stable boyfriend   RP:  Established patient presenting for annual gyn exam    HPI: Well on Micronor.  Oligomenorrhea with occasional BTB.  Pelvic US with a normal endometrial line at 5.18 in 07/2021.  No pelvic pain.  Normal vaginal secretions.  Has vaginal itching just prior and with menses.  No pain with IC.  Pap Neg 12/2019.  No h/o abnormal Pap.  Will repeat Pap at 3 years.  Urine/BMs normal.  Colono Neg 01/2014, repeat at 10 years.  Breasts normal.  Mammo Neg 02/2021.  BMI 33.57.  Health labs here today.    Past medical history,surgical history, family history and social history were all reviewed and documented in the EPIC chart.  Gynecologic History Patient's last menstrual period was 01/23/2022 (exact date).  Obstetric History OB History  Gravida Para Term Preterm AB Living  0 0 0 0 0 0  SAB IAB Ectopic Multiple Live Births  0 0 0 0 0     ROS: A ROS was performed and pertinent positives and negatives are included in the history. GENERAL: No fevers or chills. HEENT: No change in vision, no earache, sore throat or sinus congestion. NECK: No pain or stiffness. CARDIOVASCULAR: No chest pain or pressure. No palpitations. PULMONARY: No shortness of breath, cough or wheeze. GASTROINTESTINAL: No abdominal pain, nausea, vomiting or diarrhea, melena or bright red blood per rectum. GENITOURINARY: No urinary frequency, urgency, hesitancy or dysuria. MUSCULOSKELETAL: No joint or muscle pain, no back pain, no recent trauma. DERMATOLOGIC: No rash, no itching, no lesions. ENDOCRINE: No polyuria, polydipsia, no heat or cold intolerance. No recent change in weight. HEMATOLOGICAL: No anemia or easy bruising or bleeding. NEUROLOGIC: No headache, seizures, numbness, tingling or weakness. PSYCHIATRIC: No depression, no loss of interest in normal activity or change in sleep pattern.     Exam:   BP 110/68   Pulse 66   Ht 5' 2.75" (1.594  m)   Wt 188 lb (85.3 kg)   LMP 01/23/2022 (Exact Date) Comment: micronor  SpO2 99%   BMI 33.57 kg/m   Body mass index is 33.57 kg/m.  General appearance : Well developed well nourished female. No acute distress HEENT: Eyes: no retinal hemorrhage or exudates,  Neck supple, trachea midline, no carotid bruits, no thyroidmegaly Lungs: Clear to auscultation, no rhonchi or wheezes, or rib retractions  Heart: Regular rate and rhythm, no murmurs or gallops Breast:Examined in sitting and supine position were symmetrical in appearance, no palpable masses or tenderness,  no skin retraction, no nipple inversion, no nipple discharge, no skin discoloration, no axillary or supraclavicular lymphadenopathy Abdomen: no palpable masses or tenderness, no rebound or guarding Extremities: no edema or skin discoloration or tenderness  Pelvic: Vulva: Normal             Vagina: No gross lesions or discharge  Cervix: No gross lesions or discharge  Uterus  AV, about 9 cm, nodular, non-tender and mobile  Adnexa  Without masses or tenderness  Anus: Normal  Pelvic US 07/24/21: T/V images.  Anteverted enlarged uterus with multiple intramural and subserosal fibroids again noted.  The largest fibroid is anterior subserosal measured at 4.5 x 4.8 cm.  The overall uterine size is measured at 9.2 x 7.86 x 8.64 cm.  The endometrial lining is measured at 5.18 mm.  No obvious mass or abnormal vascularity is seen.  Adjacent fibroids make the endometrial lining suboptimally visualized.  Right ovary is normal with a follicular pattern.  Left ovary is atrophic in appearance with no follicles seen.  No adnexal mass.  No free fluid in the pelvis.    Assessment/Plan:  56 y.o. female for annual exam   1. Well female exam with routine gynecological exam Well on Micronor.  Oligomenorrhea with occasional BTB.  Pelvic US with a normal endometrial line at 5.18 in 07/2021.  No pelvic pain.  Normal vaginal secretions.  Has vaginal itching  just prior and with menses.  No pain with IC.  Pap Neg 12/2019.  No h/o abnormal Pap.  Will repeat Pap at 3 years.  Urine/BMs normal.  Colono Neg 01/2014, repeat at 10 years.  Breasts normal.  Mammo Neg 02/2021.  BMI 33.57.  Health labs here today.  2. Encounter for surveillance of contraceptive pills Well on Micronor.  Oligomenorrhea with occasional BTB.  Pelvic US with a normal endometrial line at 5.18 in 07/2021.  No pelvic pain.  Normal vaginal secretions. No CI to continue on the Progestin pill.  Prescription sent to pharmacy.   3. Perimenopause Decision to observe on the Progestin only pill.  Pelvic US with normal endometrial line in 07/2021.  Abnormal uterine bleeding precautions reviewed.  Will call for investigation as needed.  4. Fibroids Asymptomatic currently.  5. Yeast vaginitis Yeast vaginitis with menses.  Treat with Fluconazole as needed.  6. Class 1 obesity due to excess calories without serious comorbidity with body mass index (BMI) of 33.0 to 33.9 in adult Low calorie, increase fitness activities.  Suggested dancing.  Other orders - norethindrone (MICRONOR) 0.35 MG tablet; Take 1 tablet (0.35 mg total) by mouth daily. - fluconazole (DIFLUCAN) 150 MG tablet; Take 1 tablet (150 mg total) by mouth every other day for 3 doses.   Princess Bruins MD, 1:42 PM 02/06/2022

## 2022-03-06 ENCOUNTER — Ambulatory Visit
Admission: RE | Admit: 2022-03-06 | Discharge: 2022-03-06 | Disposition: A | Discharge status: Home / Self Care | Payer: 59 | Source: Ambulatory Visit | Attending: Obstetrics & Gynecology | Admitting: Obstetrics & Gynecology

## 2022-03-06 DIAGNOSIS — Z1231 Encounter for screening mammogram for malignant neoplasm of breast: Secondary | ICD-10-CM

## 2022-11-25 ENCOUNTER — Ambulatory Visit: Payer: 59 | Admitting: Podiatry

## 2022-11-25 ENCOUNTER — Encounter: Payer: Self-pay | Admitting: Podiatry

## 2022-11-25 ENCOUNTER — Ambulatory Visit: Payer: 59

## 2022-11-25 DIAGNOSIS — M722 Plantar fascial fibromatosis: Secondary | ICD-10-CM

## 2022-11-25 NOTE — Progress Notes (Signed)
Subjective:   Patient ID: Colleen Gonzalez, female   DOB: 57 y.o.   MRN: [id number]   HPI Patient states that over the last month she has developed a lot of pain in the bottom of her left heel and it is worse when she gets up in the morning after periods of sitting.  Patient has had orthotics but they have worn out   Review of Systems  All other systems reviewed and are negative.       Objective:  Physical Exam Vitals and nursing note reviewed.  Constitutional:      Appearance: She is well-developed.  Pulmonary:     Effort: Pulmonary effort is normal.  Musculoskeletal:        General: Normal range of motion.  Skin:    General: Skin is warm.  Neurological:     Mental Status: She is alert.     Neurovascular status found to be intact muscle strength found to be adequate range of motion adequate exquisite discomfort medial fascial band left at the insertional point of the tendon into the calcaneus with inflammation fluid of the medial band.  Patient is found to have good digital perfusion well-oriented x 3     Assessment:  Acute Planter fasciitis left with significant depression of the arch     Plan:  H&P reviewed condition went ahead today discussed long-term orthotics casted for functional orthotic devices and went ahead did sterile prep injected the fascia 3 mg Kenalog 5 mg Xylocaine.  Reappoint when orthotics return  X-rays indicate that there is depression of the arch no other indications of pathology

## 2022-12-07 NOTE — Progress Notes (Unsigned)
New Patient Office Visit  Subjective    Patient ID: Colleen Gonzalez, female    DOB: 12-05-1965  Age: 57 y.o. MRN: [id number]  CC: No chief complaint on file.   HPI Colleen Gonzalez presents to establish care ***  Outpatient Encounter Medications as of 12/10/2022  Medication Sig   acetaminophen (TYLENOL) 500 MG tablet Take 500 mg by mouth every 6 (six) hours as needed.   Cholecalciferol (VITAMIN D3 PO) Take by mouth.   fluticasone (CUTIVATE) 0.005 % ointment Apply topically.   norethindrone (MICRONOR) 0.35 MG tablet Take 1 tablet (0.35 mg total) by mouth daily.   NU-IRON 150 MG capsule SMARTSIG:1 By Mouth   triamcinolone ointment (KENALOG) 0.1 % Apply topically 2 (two) times daily.   No facility-administered encounter medications on file as of 12/10/2022.    Past Medical History:  Diagnosis Date   Acne    Allergy    Anemia    Eczema    Plantar fasciitis     Past Surgical History:  Procedure Laterality Date   PELVIC LAPAROSCOPY  10/30/1993   MYOMECTOMY, LYSIS OF ADHESIONS.Marland Kitchen   TRANSTHORACIC ECHOCARDIOGRAM  01/03/2010   EF=>55%; mild-mod TR; trace AV regurg     Family History  Problem Relation Age of Onset   Heart disease Mother        ANGINA; MI @ 83   Skin cancer Mother    Other Mother        transverse myelitis   Diabetes Father    Hypertension Father    Stroke Father    Breast cancer Paternal Aunt 16   Hypertension Maternal Grandmother    Uterine cancer Maternal Grandmother    Stroke Maternal Grandfather     Social History   Socioeconomic History   Marital status: Single    Spouse name: Not on file   Number of children: 0   Years of education: 12   Highest education level: Not on file  Occupational History   Occupation: operator(module)    Employer: [employer]  Tobacco Use   Smoking status: Never   Smokeless tobacco: Never  Vaping Use   Vaping status: Never Used  Substance and Sexual Activity   Alcohol use: Yes    Comment: OCC   Drug  use: No   Sexual activity: Yes    Partners: Male    Birth control/protection: Pill    Comment: 1st intercourse 22 yo-5 partners  Other Topics Concern   Not on file  Social History Narrative   Not on file   Social Determinants of Health   Financial Resource Strain: Not on file  Food Insecurity: Not on file  Transportation Needs: Not on file  Physical Activity: Not on file  Stress: Not on file  Social Connections: Not on file  Intimate Partner Violence: Not on file    ROS Negative unless indicated in HPI   Objective    There were no vitals taken for this visit.  Physical Exam  Last CBC Lab Results  Component Value Date   WBC 4.1 01/30/2021   HGB 11.4 (L) 01/30/2021   HCT 35.5 01/30/2021   MCV 90.1 01/30/2021   MCH 28.9 01/30/2021   RDW 12.7 01/30/2021   PLT 317 01/30/2021   Last metabolic panel Lab Results  Component Value Date   GLUCOSE 75 01/30/2021   NA 139 01/30/2021   K 4.2 01/30/2021   CL 106 01/30/2021   CO2 26 01/30/2021   BUN 17 01/30/2021   CREATININE  1.08 (H) 01/30/2021   GFRNONAA 72 11/21/2019   CALCIUM 9.0 01/30/2021   PROT 6.9 01/30/2021   ALBUMIN 4.2 11/21/2019   LABGLOB 2.1 11/21/2019   AGRATIO 2.0 11/21/2019   BILITOT 0.7 01/30/2021   ALKPHOS 93 11/21/2019   AST 14 01/30/2021   ALT 12 01/30/2021   Last lipids Lab Results  Component Value Date   CHOL 148 01/30/2021   HDL 62 01/30/2021   LDLCALC 74 01/30/2021   TRIG 50 01/30/2021   CHOLHDL 2.4 01/30/2021   Last hemoglobin A1c No results found for: "HGBA1C"      Assessment & Plan:  There are no diagnoses linked to this encounter.  No follow-ups on file.   Colleen Aran Santa Lighter, DNP [facility] [address] [place], [place] [address] [phone]

## 2022-12-10 ENCOUNTER — Ambulatory Visit: Payer: 59 | Admitting: Nurse Practitioner

## 2022-12-10 ENCOUNTER — Encounter: Payer: Self-pay | Admitting: Nurse Practitioner

## 2022-12-10 VITALS — BP 133/85 | HR 57 | Temp 98.1°F | Ht 62.0 in | Wt 194.2 lb

## 2022-12-10 DIAGNOSIS — D508 Other iron deficiency anemias: Secondary | ICD-10-CM

## 2022-12-10 DIAGNOSIS — Z0000 Encounter for general adult medical examination without abnormal findings: Secondary | ICD-10-CM

## 2022-12-10 DIAGNOSIS — E6609 Other obesity due to excess calories: Secondary | ICD-10-CM | POA: Insufficient documentation

## 2022-12-10 DIAGNOSIS — Z6835 Body mass index (BMI) 35.0-35.9, adult: Secondary | ICD-10-CM

## 2022-12-10 DIAGNOSIS — Z833 Family history of diabetes mellitus: Secondary | ICD-10-CM | POA: Insufficient documentation

## 2022-12-10 DIAGNOSIS — R7989 Other specified abnormal findings of blood chemistry: Secondary | ICD-10-CM | POA: Diagnosis not present

## 2022-12-10 DIAGNOSIS — Z8249 Family history of ischemic heart disease and other diseases of the circulatory system: Secondary | ICD-10-CM

## 2022-12-10 DIAGNOSIS — Z713 Dietary counseling and surveillance: Secondary | ICD-10-CM

## 2022-12-10 LAB — BAYER DCA HB A1C WAIVED: HB A1C (BAYER DCA - WAIVED): 5.6 % (ref 4.8–5.6)

## 2022-12-10 MED ORDER — IRON (FERROUS SULFATE) 325 (65 FE) MG PO TABS
325.0000 mg | ORAL_TABLET | Freq: Every day | ORAL | 0 refills | Status: AC
Start: 2022-12-10 — End: ?

## 2022-12-14 NOTE — Progress Notes (Signed)
Kidney function is normal Platelet is normal liver function is normal Ferritin level which is the amount of iron and storage is high, please stop taking iron supplement.

## 2022-12-15 ENCOUNTER — Other Ambulatory Visit: Payer: Self-pay | Admitting: Podiatry

## 2022-12-15 ENCOUNTER — Telehealth: Payer: Self-pay | Admitting: Podiatry

## 2022-12-15 MED ORDER — DICLOFENAC SODIUM 75 MG PO TBEC: 75.0000 mg | DELAYED_RELEASE_TABLET | Freq: Two times a day (BID) | ORAL | 2 refills | Status: DC

## 2022-12-15 NOTE — Telephone Encounter (Signed)
Pt called and is still having left foot pain. She is asking if you could call in something for inflammation. The pharmacy is [facility] in [place]. She is still waiting on her orthotics to come in.

## 2022-12-15 NOTE — Telephone Encounter (Signed)
Left message for pt that medication was called in.

## 2022-12-15 NOTE — Telephone Encounter (Signed)
done

## 2022-12-24 ENCOUNTER — Telehealth: Payer: Self-pay | Admitting: Podiatry

## 2022-12-24 NOTE — Telephone Encounter (Signed)
Lmom for pt to call back to set up appt to pick up orthotics    Balance is pending insurance

## 2022-12-29 NOTE — Telephone Encounter (Signed)
Patient called she is going to stop by this afternoon and pick up orthotics on her way to work.

## 2023-02-03 ENCOUNTER — Other Ambulatory Visit: Payer: Self-pay | Admitting: Obstetrics and Gynecology

## 2023-02-03 ENCOUNTER — Encounter: Payer: Self-pay | Admitting: Obstetrics & Gynecology

## 2023-02-03 DIAGNOSIS — Z0000 Encounter for general adult medical examination without abnormal findings: Secondary | ICD-10-CM

## 2023-02-10 ENCOUNTER — Other Ambulatory Visit (HOSPITAL_COMMUNITY)
Admission: RE | Admit: 2023-02-10 | Discharge: 2023-02-10 | Disposition: A | Discharge status: Home / Self Care | Payer: 59 | Source: Ambulatory Visit | Attending: Obstetrics and Gynecology | Admitting: Obstetrics and Gynecology

## 2023-02-10 ENCOUNTER — Encounter: Payer: Self-pay | Admitting: Obstetrics and Gynecology

## 2023-02-10 ENCOUNTER — Ambulatory Visit (INDEPENDENT_AMBULATORY_CARE_PROVIDER_SITE_OTHER): Payer: 59 | Admitting: Obstetrics and Gynecology

## 2023-02-10 VITALS — BP 114/70 | HR 63 | Ht 62.75 in | Wt 187.0 lb

## 2023-02-10 DIAGNOSIS — D219 Benign neoplasm of connective and other soft tissue, unspecified: Secondary | ICD-10-CM | POA: Diagnosis not present

## 2023-02-10 DIAGNOSIS — Z01419 Encounter for gynecological examination (general) (routine) without abnormal findings: Secondary | ICD-10-CM | POA: Diagnosis present

## 2023-02-10 DIAGNOSIS — Z1211 Encounter for screening for malignant neoplasm of colon: Secondary | ICD-10-CM

## 2023-02-10 DIAGNOSIS — N898 Other specified noninflammatory disorders of vagina: Secondary | ICD-10-CM

## 2023-02-10 MED ORDER — COMBIPATCH 0.05-0.14 MG/DAY TD PTTW: 1.0000 | MEDICATED_PATCH | TRANSDERMAL | 12 refills | Status: DC

## 2023-02-10 NOTE — Progress Notes (Signed)
57 y.o. y.o. female here for annual exam. She denies any PM bleeding. She is on micronor  No LMP recorded. Patient is perimenopausal.  Colonoscopy: 2015 Mammogram scheduled for this year Bones: does not smoke, no fractures or steroid use, no family hx of osteoporosis,  BMI 33  T/V images.  Anteverted enlarged uterus with multiple intramural and subserosal fibroids again noted.  The largest fibroid is anterior subserosal measured at 4.5 x 4.8 cm.  The overall uterine size is measured at 9.2 x 7.86 x 8.64 cm.  The endometrial lining is measured at 5.18 mm.  No obvious mass or abnormal vascularity is seen.  Adjacent fibroids make the endometrial lining suboptimally visualized.  Right ovary is normal with a follicular pattern.  Left ovary is atrophic in appearance with no follicles seen.  No adnexal mass.  No free fluid in the pelvis.    Result History  US Transvaginal Non-OB (Order #[id number]) on 08/17/2021 - Order Result History Report  MyChart Results Release  MyChart Status: Active  Body mass index is 33.39 kg/m.   Blood pressure 114/70, pulse 63, height 5' 2.75" (1.594 m), weight 187 lb (84.8 kg), SpO2 99%.  No results found for: "DIAGPAP", "HPVHIGH", "ADEQPAP"  GYN HISTORY: No results found for: "DIAGPAP", "HPVHIGH", "ADEQPAP"  OB History  Gravida Para Term Preterm AB Living  0 0 0 0 0 0  SAB IAB Ectopic Multiple Live Births  0 0 0 0 0    Past Medical History:  Diagnosis Date   Acne    Allergy    Anemia    Eczema    Essential hypertension 11/21/2019   Plantar fasciitis     Past Surgical History:  Procedure Laterality Date   PELVIC LAPAROSCOPY  10/30/1993   MYOMECTOMY, LYSIS OF ADHESIONS.Marland Kitchen   TRANSTHORACIC ECHOCARDIOGRAM  01/03/2010   EF=>55%; mild-mod TR; trace AV regurg     Current Outpatient Medications on File Prior to Visit  Medication Sig Dispense Refill   diclofenac (VOLTAREN) 75 MG EC tablet Take 1 tablet (75 mg total) by mouth 2 (two) times daily. 50  tablet 2   norethindrone (MICRONOR) 0.35 MG tablet Take 1 tablet (0.35 mg total) by mouth daily. 84 tablet 4   No current facility-administered medications on file prior to visit.    Social History   Socioeconomic History   Marital status: Single    Spouse name: Not on file   Number of children: 0   Years of education: 12   Highest education level: Not on file  Occupational History   Occupation: operator(module)    Employer: [employer]  Tobacco Use   Smoking status: Never   Smokeless tobacco: Never  Vaping Use   Vaping status: Never Used  Substance and Sexual Activity   Alcohol use: Yes    Comment: OCC   Drug use: No   Sexual activity: Yes    Partners: Male    Birth control/protection: Pill    Comment: 1st intercourse 22 yo-5 partners  Other Topics Concern   Not on file  Social History Narrative   Not on file   Social Determinants of Health   Financial Resource Strain: Low Risk  (12/10/2022)   Overall Financial Resource Strain (CARDIA)    Difficulty of Paying Living Expenses: Not hard at all  Food Insecurity: No Food Insecurity (12/10/2022)   Hunger Vital Sign    Worried About Running Out of Food in the Last Year: Never true    Ran Out of  Food in the Last Year: Never true  Transportation Needs: No Transportation Needs (12/10/2022)   PRAPARE - Administrator, Civil Service (Medical): No    Lack of Transportation (Non-Medical): No  Physical Activity: Not on file  Stress: Not on file  Social Connections: Not on file  Intimate Partner Violence: Not At Risk (12/10/2022)   Humiliation, Afraid, Rape, and Kick questionnaire    Fear of Current or Ex-Partner: No    Emotionally Abused: No    Physically Abused: No    Sexually Abused: No    Family History  Problem Relation Age of Onset   Heart disease Mother        ANGINA; MI @ 64   Skin cancer Mother    Other Mother        transverse myelitis   Diabetes Father    Hypertension Father    Stroke Father     Breast cancer Paternal Aunt 97   Hypertension Maternal Grandmother    Uterine cancer Maternal Grandmother    Stroke Maternal Grandfather      No Known Allergies    Patient's last menstrual period was No LMP recorded. Patient is perimenopausal..            Review of Systems Alls systems reviewed and are negative.     Physical Exam Vitals and nursing note reviewed. Exam conducted with a chaperone present.  Constitutional:      Appearance: Normal appearance.  HENT:     Head: Normocephalic.  Neck:     Thyroid: No thyroid mass, thyromegaly or thyroid tenderness.  Cardiovascular:     Rate and Rhythm: Normal rate and regular rhythm.  Pulmonary:     Effort: Pulmonary effort is normal.     Breath sounds: Normal breath sounds.  Chest:  Breasts:    Right: Normal. No swelling, bleeding, mass, nipple discharge, skin change or tenderness.     Left: Normal. No swelling, bleeding, mass, nipple discharge, skin change or tenderness.  Abdominal:     Palpations: Abdomen is soft.     Tenderness: There is no guarding or rebound.  Genitourinary:    General: Normal vulva.     Labia:        Right: No rash, tenderness or lesion.        Left: No rash, tenderness or lesion.      Urethra: No prolapse or urethral pain.     Vagina: Normal.     Cervix: Normal.     Uterus: Normal. Enlarged.      Adnexa: Right adnexa normal and left adnexa normal.     Comments: Fibroids Discharge with possible BV Musculoskeletal:        General: Normal range of motion.     Cervical back: Full passive range of motion without pain and normal range of motion.     Right lower leg: No edema.     Left lower leg: No edema.  Skin:    General: Skin is warm.  Neurological:     Mental Status: She is alert.       A:         Well Woman GYN exam                             P:        Pap smear collected. Can repeat in 3-5 years. Low risk no prior abnormal  Encouraged annual mammogram screening              Colonoscopy referral placed             Labs and immunizations with her primary             Counseled on vit D and calcium To stop ocp's and begin HRT.  Counseled on the r/b/a/I Discharge: nuswab sent To follow fibroids with repeat TV US  Earley Favor

## 2023-02-11 LAB — SURESWAB® ADVANCED VAGINITIS PLUS,TMA
C. trachomatis RNA, TMA: NOT DETECTED
CANDIDA SPECIES: NOT DETECTED
Candida glabrata: NOT DETECTED
N. gonorrhoeae RNA, TMA: NOT DETECTED
SURESWAB(R) ADV BACTERIAL VAGINOSIS(BV),TMA: NEGATIVE
TRICHOMONAS VAGINALIS (TV),TMA: NOT DETECTED

## 2023-02-15 LAB — CYTOLOGY - PAP
Adequacy: ABSENT
Comment: NEGATIVE
Diagnosis: NEGATIVE
High risk HPV: NEGATIVE

## 2023-02-25 ENCOUNTER — Ambulatory Visit: Payer: 59 | Admitting: Podiatry

## 2023-02-25 ENCOUNTER — Other Ambulatory Visit: Payer: Self-pay

## 2023-02-25 ENCOUNTER — Encounter: Payer: Self-pay | Admitting: Podiatry

## 2023-02-25 VITALS — Ht 62.75 in | Wt 187.0 lb

## 2023-02-25 DIAGNOSIS — M722 Plantar fascial fibromatosis: Secondary | ICD-10-CM

## 2023-02-25 MED ORDER — PREDNISONE 10 MG PO TABS: ORAL_TABLET | ORAL | 0 refills | Status: DC

## 2023-02-25 NOTE — Progress Notes (Signed)
Subjective:   Patient ID: Colleen Gonzalez, female   DOB: 57 y.o.   MRN: [id number]   HPI Patient presents stating the left heel has really been bothering her and making it hard to bear weight.  States that it is worse after periods of walking and for step   ROS      Objective:  Physical Exam  Neurovascular status intact inflammation pain around the medial band of the left plantar fascia at insertion     Assessment:  Acute fasciitis left at the insertion of the tendon calcaneus     Plan:  Due to the difficulty in weightbearing I did dispense air fracture walker properly fitted to her lower leg to reduce the inflammation and pain and allow a better walking pattern and did do sterile prep and injected the plantar fascia 3 mg Kenalog 5 mg Xylocaine  X-rays were negative for any changes when evaluated

## 2023-03-09 ENCOUNTER — Ambulatory Visit: Payer: 59

## 2023-03-10 ENCOUNTER — Ambulatory Visit
Admission: RE | Admit: 2023-03-10 | Discharge: 2023-03-10 | Disposition: A | Discharge status: Home / Self Care | Payer: 59 | Source: Ambulatory Visit | Attending: Obstetrics and Gynecology | Admitting: Obstetrics and Gynecology

## 2023-03-10 DIAGNOSIS — Z0000 Encounter for general adult medical examination without abnormal findings: Secondary | ICD-10-CM

## 2023-03-11 ENCOUNTER — Ambulatory Visit (INDEPENDENT_AMBULATORY_CARE_PROVIDER_SITE_OTHER): Payer: 59 | Admitting: Obstetrics and Gynecology

## 2023-03-11 ENCOUNTER — Ambulatory Visit (INDEPENDENT_AMBULATORY_CARE_PROVIDER_SITE_OTHER): Payer: 59

## 2023-03-11 ENCOUNTER — Other Ambulatory Visit: Payer: Self-pay | Admitting: Obstetrics and Gynecology

## 2023-03-11 DIAGNOSIS — D219 Benign neoplasm of connective and other soft tissue, unspecified: Secondary | ICD-10-CM | POA: Diagnosis not present

## 2023-03-11 DIAGNOSIS — Z01419 Encounter for gynecological examination (general) (routine) without abnormal findings: Secondary | ICD-10-CM

## 2023-03-11 DIAGNOSIS — Z712 Person consulting for explanation of examination or test findings: Secondary | ICD-10-CM

## 2023-03-11 NOTE — Progress Notes (Unsigned)
Patient to return to discuss results. Had to leave after [place] Dr. Karma Greaser

## 2023-03-16 ENCOUNTER — Ambulatory Visit: Payer: 59 | Admitting: Nurse Practitioner

## 2023-03-17 ENCOUNTER — Ambulatory Visit: Payer: 59 | Admitting: Nurse Practitioner

## 2023-03-17 ENCOUNTER — Encounter: Payer: Self-pay | Admitting: Nurse Practitioner

## 2023-03-17 VITALS — BP 112/71 | HR 75 | Temp 98.2°F | Ht 62.75 in | Wt 190.2 lb

## 2023-03-17 DIAGNOSIS — R7989 Other specified abnormal findings of blood chemistry: Secondary | ICD-10-CM | POA: Diagnosis not present

## 2023-03-17 DIAGNOSIS — N3000 Acute cystitis without hematuria: Secondary | ICD-10-CM | POA: Insufficient documentation

## 2023-03-17 DIAGNOSIS — D508 Other iron deficiency anemias: Secondary | ICD-10-CM | POA: Diagnosis not present

## 2023-03-17 NOTE — Progress Notes (Signed)
Established Patient Office Visit  Subjective   Patient ID: Colleen Gonzalez, female    DOB: 09/29/1965  Age: 57 y.o. MRN: [id number]  Chief Complaint  Patient presents with   Medical Management of Chronic Issues    3 month    HPI The patient is a 57 year old female presenting today for a 77-month follow-up for chronic diseases management .She has a past medical history significant for anemia, low vitamin D, and uterine leiomyoma. She was seen by her GYN and reports that her contraceptive was discontinued, and she was only prescribed vitamins, magnesium, and calcium supplements. Her anemia is now well-controlled, with recent lab results showing a ferritin level of 370, and her iron supplementation was discontinued as a result. The patient denies any recent increase in fatigue or tiredness and reports feeling generally well in this regard. For her low vitamin D, she is currently taking a D3 supplement. No other significant concerns or changes in her health are reported today. She is following up for continued management and monitoring of her chronic condition and lab values.   Patient Active Problem List   Diagnosis Date Noted   Acute cystitis without hematuria 03/17/2023   Routine medical exam 12/10/2022   Low vitamin D level 12/10/2022   Family history of angina 12/10/2022   Family history of diabetes mellitus (DM) 12/10/2022   Class 2 obesity due to excess calories without serious comorbidity with body mass index (BMI) of 35.0 to 35.9 in adult 12/10/2022   Dietary counseling 12/10/2022   Sprain of metacarpophalangeal joint of left index finger 04/24/2019   Vitamin D insufficiency 01/14/2018   Epidermal inclusion cyst 09/18/2016   Irritant contact dermatitis 09/18/2016   Family history of premature CAD 04/28/2013   Tricuspid murmur 04/28/2013   Weight gain 08/19/2012   Family history of diabetes mellitus 08/19/2012   Anemia 08/28/2011   Uterine leiomyoma 08/28/2011   Past Medical  History:  Diagnosis Date   Acne    Allergy    Anemia    Eczema    Essential hypertension 11/21/2019   Plantar fasciitis    Past Surgical History:  Procedure Laterality Date   PELVIC LAPAROSCOPY  10/30/1993   MYOMECTOMY, LYSIS OF ADHESIONS.Marland Kitchen   TRANSTHORACIC ECHOCARDIOGRAM  01/03/2010   EF=>55%; mild-mod TR; trace AV regurg    Social History   Tobacco Use   Smoking status: Never   Smokeless tobacco: Never  Vaping Use   Vaping status: Never Used  Substance Use Topics   Alcohol use: Yes    Comment: OCC   Drug use: No   Social History   Socioeconomic History   Marital status: Single    Spouse name: Not on file   Number of children: 0   Years of education: 12   Highest education level: Not on file  Occupational History   Occupation: operator(module)    Employer: [employer]  Tobacco Use   Smoking status: Never   Smokeless tobacco: Never  Vaping Use   Vaping status: Never Used  Substance and Sexual Activity   Alcohol use: Yes    Comment: OCC   Drug use: No   Sexual activity: Yes    Partners: Male    Birth control/protection: Pill    Comment: 1st intercourse 22 yo-5 partners  Other Topics Concern   Not on file  Social History Narrative   Not on file   Social Determinants of Health   Financial Resource Strain: Low Risk  (12/10/2022)   Overall Financial  Resource Strain (CARDIA)    Difficulty of Paying Living Expenses: Not hard at all  Food Insecurity: No Food Insecurity (12/10/2022)   Hunger Vital Sign    Worried About Running Out of Food in the Last Year: Never true    Ran Out of Food in the Last Year: Never true  Transportation Needs: No Transportation Needs (12/10/2022)   PRAPARE - Administrator, Civil Service (Medical): No    Lack of Transportation (Non-Medical): No  Physical Activity: Not on file  Stress: Not on file  Social Connections: Not on file  Intimate Partner Violence: Not At Risk (12/10/2022)   Humiliation, Afraid, Rape, and Kick  questionnaire    Fear of Current or Ex-Partner: No    Emotionally Abused: No    Physically Abused: No    Sexually Abused: No   Family Status  Relation Name Status   Mother  Alive   Father  Alive   Emelda Brothers  (Not Specified)   MGM  Alive   Brother  Alive   MGF  Alive  No partnership data on file   Family History  Problem Relation Age of Onset   Heart disease Mother        ANGINA; MI @ 29   Skin cancer Mother    Other Mother        transverse myelitis   Diabetes Father    Hypertension Father    Stroke Father    Breast cancer Paternal Aunt 40   Hypertension Maternal Grandmother    Uterine cancer Maternal Grandmother    Stroke Maternal Grandfather    No Known Allergies    Review of Systems  Constitutional:  Negative for chills, fever and malaise/fatigue.  HENT:  Negative for ear pain and sore throat.   Respiratory:  Negative for cough and shortness of breath.   Cardiovascular:  Negative for chest pain and leg swelling.  Gastrointestinal:  Negative for blood in stool, nausea and vomiting.  Genitourinary:  Negative for frequency, hematuria and urgency.  Musculoskeletal:  Negative for falls and myalgias.  Skin:  Negative for itching and rash.  Neurological:  Negative for dizziness and headaches.  Endo/Heme/Allergies:  Negative for environmental allergies and polydipsia. Does not bruise/bleed easily.  Psychiatric/Behavioral:  Negative for depression. The patient does not have insomnia.    Negative unless indicated in HPI   Objective:     BP 112/71   Pulse 75   Temp 98.2 F (36.8 C) (Temporal)   Ht 5' 2.75" (1.594 m)   Wt 190 lb 3.2 oz (86.3 kg)   SpO2 97%   BMI 33.96 kg/m  BP Readings from Last 3 Encounters:  03/17/23 112/71  02/10/23 114/70  12/10/22 133/85   Wt Readings from Last 3 Encounters:  03/17/23 190 lb 3.2 oz (86.3 kg)  02/25/23 187 lb (84.8 kg)  02/10/23 187 lb (84.8 kg)      Physical Exam Vitals and nursing note reviewed.  Constitutional:       Appearance: Normal appearance.  HENT:     Head: Normocephalic and atraumatic.  Eyes:     General: No scleral icterus.    Extraocular Movements: Extraocular movements intact.     Conjunctiva/sclera: Conjunctivae normal.     Pupils: Pupils are equal, round, and reactive to light.  Cardiovascular:     Rate and Rhythm: Normal rate and regular rhythm.  Pulmonary:     Effort: Pulmonary effort is normal.     Breath sounds: Normal breath sounds.  Musculoskeletal:        General: Normal range of motion.     Right lower leg: No edema.     Left lower leg: No edema.  Skin:    General: Skin is warm and dry.     Findings: No rash.  Neurological:     Mental Status: She is alert and oriented to person, place, and time. Mental status is at baseline.  Psychiatric:        Mood and Affect: Mood normal.        Behavior: Behavior normal.        Thought Content: Thought content normal.        Judgment: Judgment normal.      No results found for any visits on 03/17/23.  Last CBC Lab Results  Component Value Date   WBC 4.0 12/10/2022   HGB 11.1 12/10/2022   HCT 34.8 12/10/2022   MCV 90 12/10/2022   MCH 28.6 12/10/2022   RDW 12.7 12/10/2022   PLT 307 12/10/2022   Last metabolic panel Lab Results  Component Value Date   GLUCOSE 86 12/10/2022   NA 140 12/10/2022   K 4.4 12/10/2022   CL 105 12/10/2022   CO2 23 12/10/2022   BUN 16 12/10/2022   CREATININE 0.92 12/10/2022   EGFR 74 12/10/2022   CALCIUM 8.8 12/10/2022   PROT 6.6 12/10/2022   ALBUMIN 4.3 12/10/2022   LABGLOB 2.3 12/10/2022   AGRATIO 2.0 11/21/2019   BILITOT 0.5 12/10/2022   ALKPHOS 76 12/10/2022   AST 14 12/10/2022   ALT 16 12/10/2022   Last lipids Lab Results  Component Value Date   CHOL 137 12/10/2022   HDL 57 12/10/2022   LDLCALC 70 12/10/2022   TRIG 41 12/10/2022   CHOLHDL 2.4 12/10/2022   Last hemoglobin A1c Lab Results  Component Value Date   HGBA1C 5.6 12/10/2022   Last thyroid functions Lab  Results  Component Value Date   TSH 1.500 12/10/2022   T4TOTAL 4.8 12/10/2022        Assessment & Plan:  Iron deficiency anemia secondary to inadequate dietary iron intake  Low vitamin D level   Ioanna is 57 yrs old African-American female, no acute distress stress Anemia: Will repeat iron study at her next follow-up to determine the need to restart iron supplement Continue all prescribed medication All question answered  Encourage healthy lifestyle choices, including diet (rich in fruits, vegetables, and lean proteins, and low in salt and simple carbohydrates) and exercise (at least 30 minutes of moderate physical activity daily).     The above assessment and management plan was discussed with the patient. The patient verbalized understanding of and has agreed to the management plan. Patient is aware to call the clinic if they develop any new symptoms or if symptoms persist or worsen. Patient is aware when to return to the clinic for a follow-up visit. Patient educated on when it is appropriate to go to the emergency department.  Return in about 6 months (around 09/14/2023) for repeat iron study.    Arrie Aran Santa Lighter, DNP [facility] [address] [place], [place] [address] [phone]

## 2023-03-18 ENCOUNTER — Ambulatory Visit: Payer: 59 | Admitting: Podiatry

## 2023-04-06 NOTE — Progress Notes (Signed)
Patient presents for [place] results. Enlarged fibroid uterus Uterus measuring 11.44 cm With multiple fibroids.  The largest measuring 5.85cm.  Next 4.42cm. Remaining fibroids in range of 0.97cm to 3.26cm.  Left and right ovary in normal size range. Left ovary with "stuck" appearance to the uterus  No adnexal masses of free fluid   PUS 07/24/21  T/V images.  Anteverted enlarged uterus with multiple intramural and subserosal fibroids again noted.  The largest fibroid is anterior subserosal measured at 4.5 x 4.8 cm.  The overall uterine size is measured at 9.2 x 7.86 x 8.64 cm.  The endometrial lining is measured at 5.18 mm.  No obvious mass or abnormal vascularity is seen.  Adjacent fibroids make the endometrial lining suboptimally visualized.  Right ovary is normal with a follicular pattern.  Left ovary is atrophic in appearance with no follicles seen.  No adnexal mass.  No free fluid in the pelvis.    A/p Discuss [place] results, fibroids Patient could not stay.  Called patient and left message to return call or come for visit to discuss results and review HRT options.  Dr. Karma Greaser

## 2023-04-19 ENCOUNTER — Telehealth: Payer: 59 | Admitting: Obstetrics and Gynecology

## 2023-04-29 ENCOUNTER — Ambulatory Visit: Payer: 59 | Admitting: Obstetrics and Gynecology

## 2023-04-29 ENCOUNTER — Encounter: Payer: Self-pay | Admitting: Obstetrics and Gynecology

## 2023-04-29 VITALS — BP 118/70 | HR 66

## 2023-04-29 DIAGNOSIS — Z712 Person consulting for explanation of examination or test findings: Secondary | ICD-10-CM | POA: Diagnosis not present

## 2023-04-29 NOTE — Progress Notes (Signed)
Patient presents to discuss [place] results. No pelvic pain or VB  [place] with  Narrative & Impression  Anteverted uterus 11.4 cm Enlarged with multiple fibroids, largest = 59 mm (RT LAT)   EM not visualized T/A no or TN D/T fibroids   RO-WNL   LO-WNL appears "stuck" to the uterus (see cineclip)   No adnexal masses No free fluid    A/p [place] results  [place] results printed and reviewed with patient. [place] on 07/24/21 overall with similar results. Patient encouraged to return with any PM bleeding. She is doing well on the combipatch for now .05 -0.14 RTC with any concerns and with annual exams.  10 minutes spent on reviewing records, imaging,  and one on one patient time and counseling patient and documentation Dr. Karma Greaser

## 2023-06-18 ENCOUNTER — Telehealth: Payer: Self-pay

## 2023-06-18 NOTE — Telephone Encounter (Signed)
 Patient called & left message on refill voicemail stating that her combipatch  that [facility] has always has to order it & she wanted a new rx sent to [facility]. I called them & they said they would have to order it also. I called the patient back & left her a message letting her know that & that she can try to call [facility] to see if they have it in stock. Patient told that if she has an active rx that they can transfer the rx for her to the pharmacy she would like to us . Patient told that phone lines are now off & she can contact us  on Monday if any other problems.

## 2023-06-23 NOTE — Telephone Encounter (Signed)
Will wait to see if patient calls back with anymore problems of getting rx.

## 2023-09-15 ENCOUNTER — Ambulatory Visit: Payer: 59 | Admitting: Nurse Practitioner

## 2023-10-05 NOTE — Progress Notes (Unsigned)
 Established Patient Office Visit  Subjective  Patient ID: Colleen Gonzalez, female    DOB: 26-Jul-1965  Age: 58 y.o. MRN: [id number]  No chief complaint on file.   HPI Colleen Gonzalez anemia, The patient is a -year-old  with a history of anemia, currently managed with daily iron  supplements. Reports feeling less fatigued and has noticed an improvement in her energy levels. Recent lab results show a rise in hemoglobin levels, indicating positive response to treatment. Continues to follow her prescribed regimen and attends regular follow-up appointments.   Patient Active Problem List   Diagnosis Date Noted   Acute cystitis without hematuria 03/17/2023   Routine medical exam 12/10/2022   Low vitamin D  level 12/10/2022   Family history of angina 12/10/2022   Family history of diabetes mellitus (DM) 12/10/2022   Class 2 obesity due to excess calories without serious comorbidity with body mass index (BMI) of 35.0 to 35.9 in adult 12/10/2022   Dietary counseling 12/10/2022   Sprain of metacarpophalangeal joint of left index finger 04/24/2019   Vitamin D  insufficiency 01/14/2018   Epidermal inclusion cyst 09/18/2016   Irritant contact dermatitis 09/18/2016   Family history of premature CAD 04/28/2013   Tricuspid murmur 04/28/2013   Weight gain 08/19/2012   Family history of diabetes mellitus 08/19/2012   Anemia 08/28/2011   Uterine leiomyoma 08/28/2011   Past Medical History:  Diagnosis Date   Acne    Allergy     Anemia    Eczema    Essential hypertension 11/21/2019   Plantar fasciitis    Past Surgical History:  Procedure Laterality Date   PELVIC LAPAROSCOPY  10/30/1993   MYOMECTOMY, LYSIS OF ADHESIONS.Aaron Aas   TRANSTHORACIC ECHOCARDIOGRAM  01/03/2010   EF=>55%; mild-mod TR; trace AV regurg    Social History   Tobacco Use   Smoking status: Never   Smokeless tobacco: Never  Vaping Use   Vaping status: Never Used  Substance Use Topics   Alcohol use: Yes    Comment: OCC   Drug  use: No   Social History   Socioeconomic History   Marital status: Single    Spouse name: Not on file   Number of children: 0   Years of education: 12   Highest education level: Not on file  Occupational History   Occupation: operator(module)    Employer: [employer]  Tobacco Use   Smoking status: Never   Smokeless tobacco: Never  Vaping Use   Vaping status: Never Used  Substance and Sexual Activity   Alcohol use: Yes    Comment: OCC   Drug use: No   Sexual activity: Yes    Partners: Male    Comment: 1st intercourse 22 yo-5 partners  Other Topics Concern   Not on file  Social History Narrative   Not on file   Social Drivers of Health   Financial Resource Strain: Low Risk  (12/10/2022)   Overall Financial Resource Strain (CARDIA)    Difficulty of Paying Living Expenses: Not hard at all  Food Insecurity: No Food Insecurity (12/10/2022)   Hunger Vital Sign    Worried About Running Out of Food in the Last Year: Never true    Ran Out of Food in the Last Year: Never true  Transportation Needs: No Transportation Needs (12/10/2022)   PRAPARE - Administrator, Civil Service (Medical): No    Lack of Transportation (Non-Medical): No  Physical Activity: Not on file  Stress: Not on file  Social Connections: Not  on file  Intimate Partner Violence: Not At Risk (12/10/2022)   Humiliation, Afraid, Rape, and Kick questionnaire    Fear of Current or Ex-Partner: No    Emotionally Abused: No    Physically Abused: No    Sexually Abused: No   Family Status  Relation Name Status   Mother  Alive   Father  Alive   Bernarda Bride  (Not Specified)   MGM  Alive   Brother  Alive   MGF  Alive  No partnership data on file   Family History  Problem Relation Age of Onset   Heart disease Mother        ANGINA; MI @ 44   Skin cancer Mother    Other Mother        transverse myelitis   Diabetes Father    Hypertension Father    Stroke Father    Breast cancer Paternal Aunt 26    Hypertension Maternal Grandmother    Uterine cancer Maternal Grandmother    Stroke Maternal Grandfather    No Known Allergies    ROS Negative unless indicated in HPI   Objective:     There were no vitals taken for this visit. BP Readings from Last 3 Encounters:  04/29/23 118/70  03/17/23 112/71  02/10/23 114/70   Wt Readings from Last 3 Encounters:  03/17/23 190 lb 3.2 oz (86.3 kg)  02/25/23 187 lb (84.8 kg)  02/10/23 187 lb (84.8 kg)      Physical Exam   No results found for any visits on 10/06/23.  Last CBC Lab Results  Component Value Date   WBC 4.0 12/10/2022   HGB 11.1 12/10/2022   HCT 34.8 12/10/2022   MCV 90 12/10/2022   MCH 28.6 12/10/2022   RDW 12.7 12/10/2022   PLT 307 12/10/2022   Last metabolic panel Lab Results  Component Value Date   GLUCOSE 86 12/10/2022   NA 140 12/10/2022   K 4.4 12/10/2022   CL 105 12/10/2022   CO2 23 12/10/2022   BUN 16 12/10/2022   CREATININE 0.92 12/10/2022   EGFR 74 12/10/2022   CALCIUM 8.8 12/10/2022   PROT 6.6 12/10/2022   ALBUMIN 4.3 12/10/2022   LABGLOB 2.3 12/10/2022   AGRATIO 2.0 11/21/2019   BILITOT 0.5 12/10/2022   ALKPHOS 76 12/10/2022   AST 14 12/10/2022   ALT 16 12/10/2022   Last lipids Lab Results  Component Value Date   CHOL 137 12/10/2022   HDL 57 12/10/2022   LDLCALC 70 12/10/2022   TRIG 41 12/10/2022   CHOLHDL 2.4 12/10/2022   Last hemoglobin A1c Lab Results  Component Value Date   HGBA1C 5.6 12/10/2022   Last thyroid  functions Lab Results  Component Value Date   TSH 1.500 12/10/2022   T4TOTAL 4.8 12/10/2022        Assessment & Plan:  There are no diagnoses linked to this encounter. Continue healthy lifestyle choices, including diet (rich in fruits, vegetables, and lean proteins, and low in salt and simple carbohydrates) and exercise (at least 30 minutes of moderate physical activity daily).     The above assessment and management plan was discussed with the patient. The  patient verbalized understanding of and has agreed to the management plan. Patient is aware to call the clinic if they develop any new symptoms or if symptoms persist or worsen. Patient is aware when to return to the clinic for a follow-up visit. Patient educated on when it is appropriate to go to the  emergency department.  No follow-ups on file.    Georges Victorio [place] Thompson, DNP [facility] [address] [place], [place] [address] [phone]    Note: This document was prepared by [employer] voice dictation technology and any errors that results from this process are unintentional.

## 2023-10-06 ENCOUNTER — Ambulatory Visit: Admitting: Nurse Practitioner

## 2023-10-06 ENCOUNTER — Encounter: Payer: Self-pay | Admitting: Nurse Practitioner

## 2023-10-06 VITALS — BP 105/72 | HR 67 | Temp 97.5°F | Ht 62.75 in | Wt 202.8 lb

## 2023-10-06 DIAGNOSIS — E559 Vitamin D deficiency, unspecified: Secondary | ICD-10-CM

## 2023-10-06 DIAGNOSIS — Z1211 Encounter for screening for malignant neoplasm of colon: Secondary | ICD-10-CM

## 2023-10-06 DIAGNOSIS — Z6836 Body mass index (BMI) 36.0-36.9, adult: Secondary | ICD-10-CM

## 2023-10-06 DIAGNOSIS — D508 Other iron deficiency anemias: Secondary | ICD-10-CM

## 2023-10-06 DIAGNOSIS — E66812 Obesity, class 2: Secondary | ICD-10-CM

## 2023-10-06 MED ORDER — TOPIRAMATE 25 MG PO TABS: 25.0000 mg | ORAL_TABLET | Freq: Two times a day (BID) | ORAL | 0 refills | Status: AC

## 2023-10-22 ENCOUNTER — Telehealth: Payer: Self-pay | Admitting: Obstetrics and Gynecology

## 2023-10-22 DIAGNOSIS — E2839 Other primary ovarian failure: Secondary | ICD-10-CM

## 2023-10-22 NOTE — Telephone Encounter (Signed)
 Reminder for bone scan

## 2023-11-03 ENCOUNTER — Encounter (INDEPENDENT_AMBULATORY_CARE_PROVIDER_SITE_OTHER): Payer: Self-pay | Admitting: *Deleted

## 2024-01-06 ENCOUNTER — Ambulatory Visit: Admitting: Nurse Practitioner

## 2024-01-19 ENCOUNTER — Ambulatory Visit: Admitting: Nurse Practitioner

## 2024-02-10 ENCOUNTER — Other Ambulatory Visit: Payer: Self-pay | Admitting: Obstetrics and Gynecology

## 2024-02-10 DIAGNOSIS — Z1231 Encounter for screening mammogram for malignant neoplasm of breast: Secondary | ICD-10-CM

## 2024-03-11 ENCOUNTER — Other Ambulatory Visit: Payer: Self-pay | Admitting: Obstetrics and Gynecology

## 2024-03-13 NOTE — Telephone Encounter (Signed)
 Med refill request:   COMBIPATCH  0.05-0.14 MG/DAY  Start:  04/26/23 Disp:   DAW - 1 patch 2 (two) times a week. (24 patches) Refills:  0  Last AEX:  02/10/23 LOV:  04/29/23 Next AEX:  04/28/24  Last MMG (if hormonal med):  03/10/23 Refill authorized? Please Advise.

## 2024-03-14 ENCOUNTER — Other Ambulatory Visit: Payer: Self-pay | Admitting: Obstetrics and Gynecology

## 2024-03-14 MED ORDER — COMBIPATCH 0.05-0.14 MG/DAY TD PTTW: 1.0000 | MEDICATED_PATCH | TRANSDERMAL | 3 refills | Status: AC

## 2024-03-16 ENCOUNTER — Ambulatory Visit
Admission: RE | Admit: 2024-03-16 | Discharge: 2024-03-16 | Disposition: A | Source: Ambulatory Visit | Attending: Obstetrics and Gynecology

## 2024-03-16 DIAGNOSIS — Z1231 Encounter for screening mammogram for malignant neoplasm of breast: Secondary | ICD-10-CM

## 2024-03-20 ENCOUNTER — Ambulatory Visit: Payer: Self-pay | Admitting: Obstetrics and Gynecology

## 2024-03-22 NOTE — Telephone Encounter (Addendum)
 Patient's most recent mammogram:  03/16/2024  Respectfully,  IC

## 2024-04-28 ENCOUNTER — Ambulatory Visit: Admitting: Obstetrics and Gynecology

## 2024-05-08 ENCOUNTER — Encounter (INDEPENDENT_AMBULATORY_CARE_PROVIDER_SITE_OTHER): Payer: Self-pay | Admitting: *Deleted

## 2024-05-18 ENCOUNTER — Ambulatory Visit: Admitting: Obstetrics and Gynecology

## 2024-06-07 ENCOUNTER — Ambulatory Visit: Admitting: Podiatry

## 2024-06-19 ENCOUNTER — Ambulatory Visit: Admitting: Podiatry
